# Patient Record
Sex: Female | Born: 1965 | Race: White | Hispanic: No | Marital: Married | Smoking: Never smoker
Health system: Southern US, Community
[De-identification: ages and names within clinical notes are randomized; demographics above are authoritative.]

## PROBLEM LIST (undated history)

## (undated) DIAGNOSIS — Z8489 Family history of other specified conditions: Secondary | ICD-10-CM

## (undated) DIAGNOSIS — F419 Anxiety disorder, unspecified: Secondary | ICD-10-CM

## (undated) DIAGNOSIS — K635 Polyp of colon: Secondary | ICD-10-CM

## (undated) DIAGNOSIS — I499 Cardiac arrhythmia, unspecified: Secondary | ICD-10-CM

## (undated) DIAGNOSIS — K219 Gastro-esophageal reflux disease without esophagitis: Secondary | ICD-10-CM

## (undated) DIAGNOSIS — L509 Urticaria, unspecified: Secondary | ICD-10-CM

## (undated) HISTORY — DX: Polyp of colon: K63.5

## (undated) HISTORY — PX: COLONOSCOPY WITH PROPOFOL: SHX5780

## (undated) HISTORY — DX: Urticaria, unspecified: L50.9

---

## 1990-11-28 HISTORY — PX: LAPAROSCOPY: SHX197

## 1998-10-26 ENCOUNTER — Other Ambulatory Visit: Admission: RE | Admit: 1998-10-26 | Discharge: 1998-10-26 | Payer: Self-pay | Admitting: Obstetrics & Gynecology

## 2000-02-08 ENCOUNTER — Other Ambulatory Visit: Admission: RE | Admit: 2000-02-08 | Discharge: 2000-02-08 | Payer: Self-pay | Admitting: Obstetrics & Gynecology

## 2001-05-08 ENCOUNTER — Other Ambulatory Visit: Admission: RE | Admit: 2001-05-08 | Discharge: 2001-05-08 | Payer: Self-pay | Admitting: Obstetrics & Gynecology

## 2002-07-23 ENCOUNTER — Other Ambulatory Visit: Admission: RE | Admit: 2002-07-23 | Discharge: 2002-07-23 | Payer: Self-pay | Admitting: Obstetrics & Gynecology

## 2003-10-07 ENCOUNTER — Other Ambulatory Visit: Admission: RE | Admit: 2003-10-07 | Discharge: 2003-10-07 | Payer: Self-pay | Admitting: Obstetrics & Gynecology

## 2005-02-02 ENCOUNTER — Other Ambulatory Visit: Admission: RE | Admit: 2005-02-02 | Discharge: 2005-02-02 | Payer: Self-pay | Admitting: Obstetrics & Gynecology

## 2006-10-30 ENCOUNTER — Encounter: Admission: RE | Admit: 2006-10-30 | Discharge: 2006-10-30 | Payer: Self-pay | Admitting: Obstetrics & Gynecology

## 2006-11-07 ENCOUNTER — Encounter: Admission: RE | Admit: 2006-11-07 | Discharge: 2006-11-07 | Payer: Self-pay | Admitting: Obstetrics & Gynecology

## 2007-11-01 ENCOUNTER — Encounter: Admission: RE | Admit: 2007-11-01 | Discharge: 2007-11-01 | Payer: Self-pay | Admitting: Obstetrics & Gynecology

## 2007-11-07 ENCOUNTER — Encounter: Admission: RE | Admit: 2007-11-07 | Discharge: 2007-11-07 | Payer: Self-pay | Admitting: Obstetrics & Gynecology

## 2008-11-05 ENCOUNTER — Encounter: Admission: RE | Admit: 2008-11-05 | Discharge: 2008-11-05 | Payer: Self-pay | Admitting: Obstetrics & Gynecology

## 2008-11-13 ENCOUNTER — Encounter: Admission: RE | Admit: 2008-11-13 | Discharge: 2008-11-13 | Payer: Self-pay | Admitting: Obstetrics & Gynecology

## 2009-04-24 ENCOUNTER — Emergency Department (HOSPITAL_BASED_OUTPATIENT_CLINIC_OR_DEPARTMENT_OTHER): Admission: EM | Admit: 2009-04-24 | Discharge: 2009-04-24 | Payer: Self-pay | Admitting: Emergency Medicine

## 2009-04-24 ENCOUNTER — Ambulatory Visit: Payer: Self-pay | Admitting: Diagnostic Radiology

## 2009-05-06 ENCOUNTER — Encounter: Admission: RE | Admit: 2009-05-06 | Discharge: 2009-05-06 | Payer: Self-pay | Admitting: Obstetrics & Gynecology

## 2009-11-10 ENCOUNTER — Encounter: Admission: RE | Admit: 2009-11-10 | Discharge: 2009-11-10 | Payer: Self-pay | Admitting: Obstetrics & Gynecology

## 2010-08-02 IMAGING — CR DG CHEST 2V
2 series · 2 of 2 positions shown · non-contrast
Comparison: None

CLINICAL DATA: Left-sided neck pain, numbness

CHEST - 2 VIEW

[w chest pa]
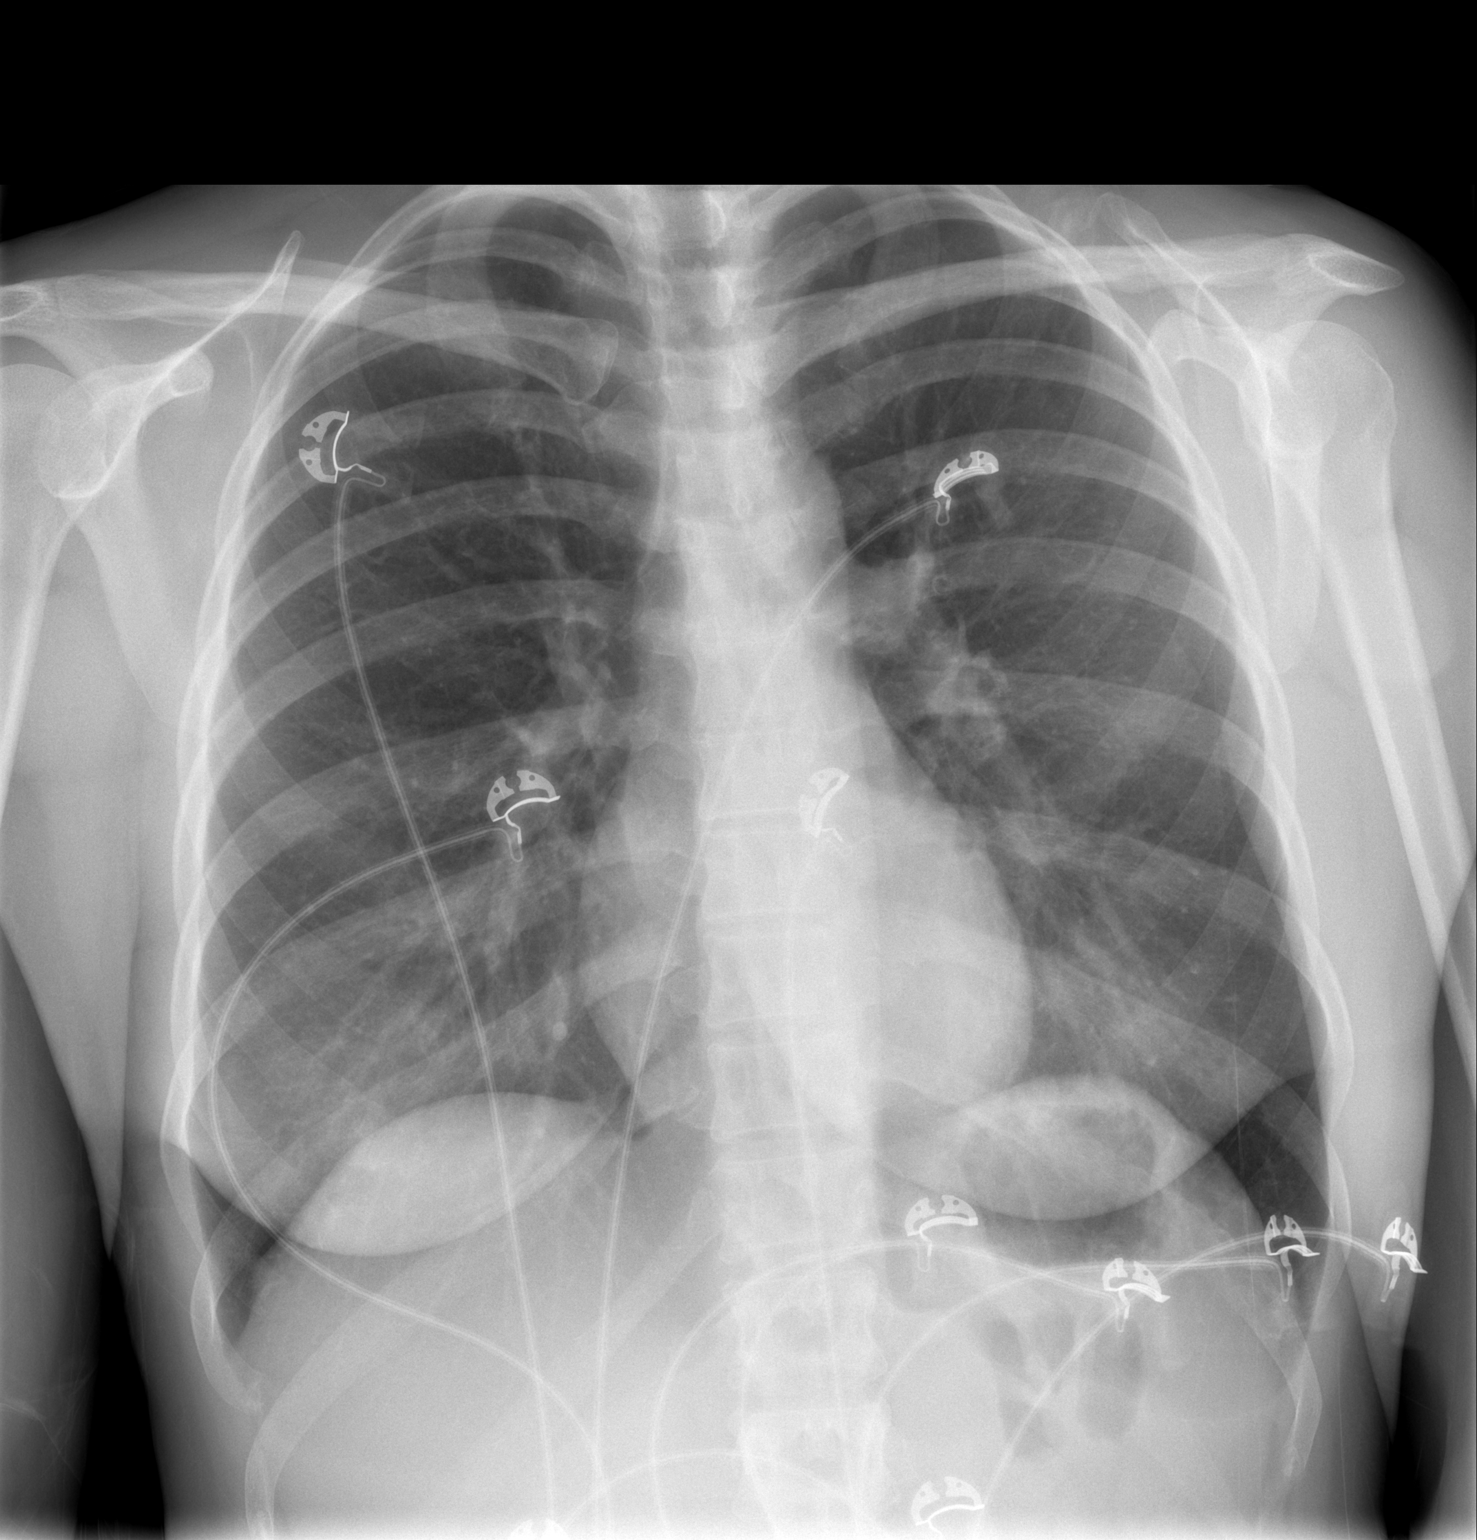

[w chest lat]
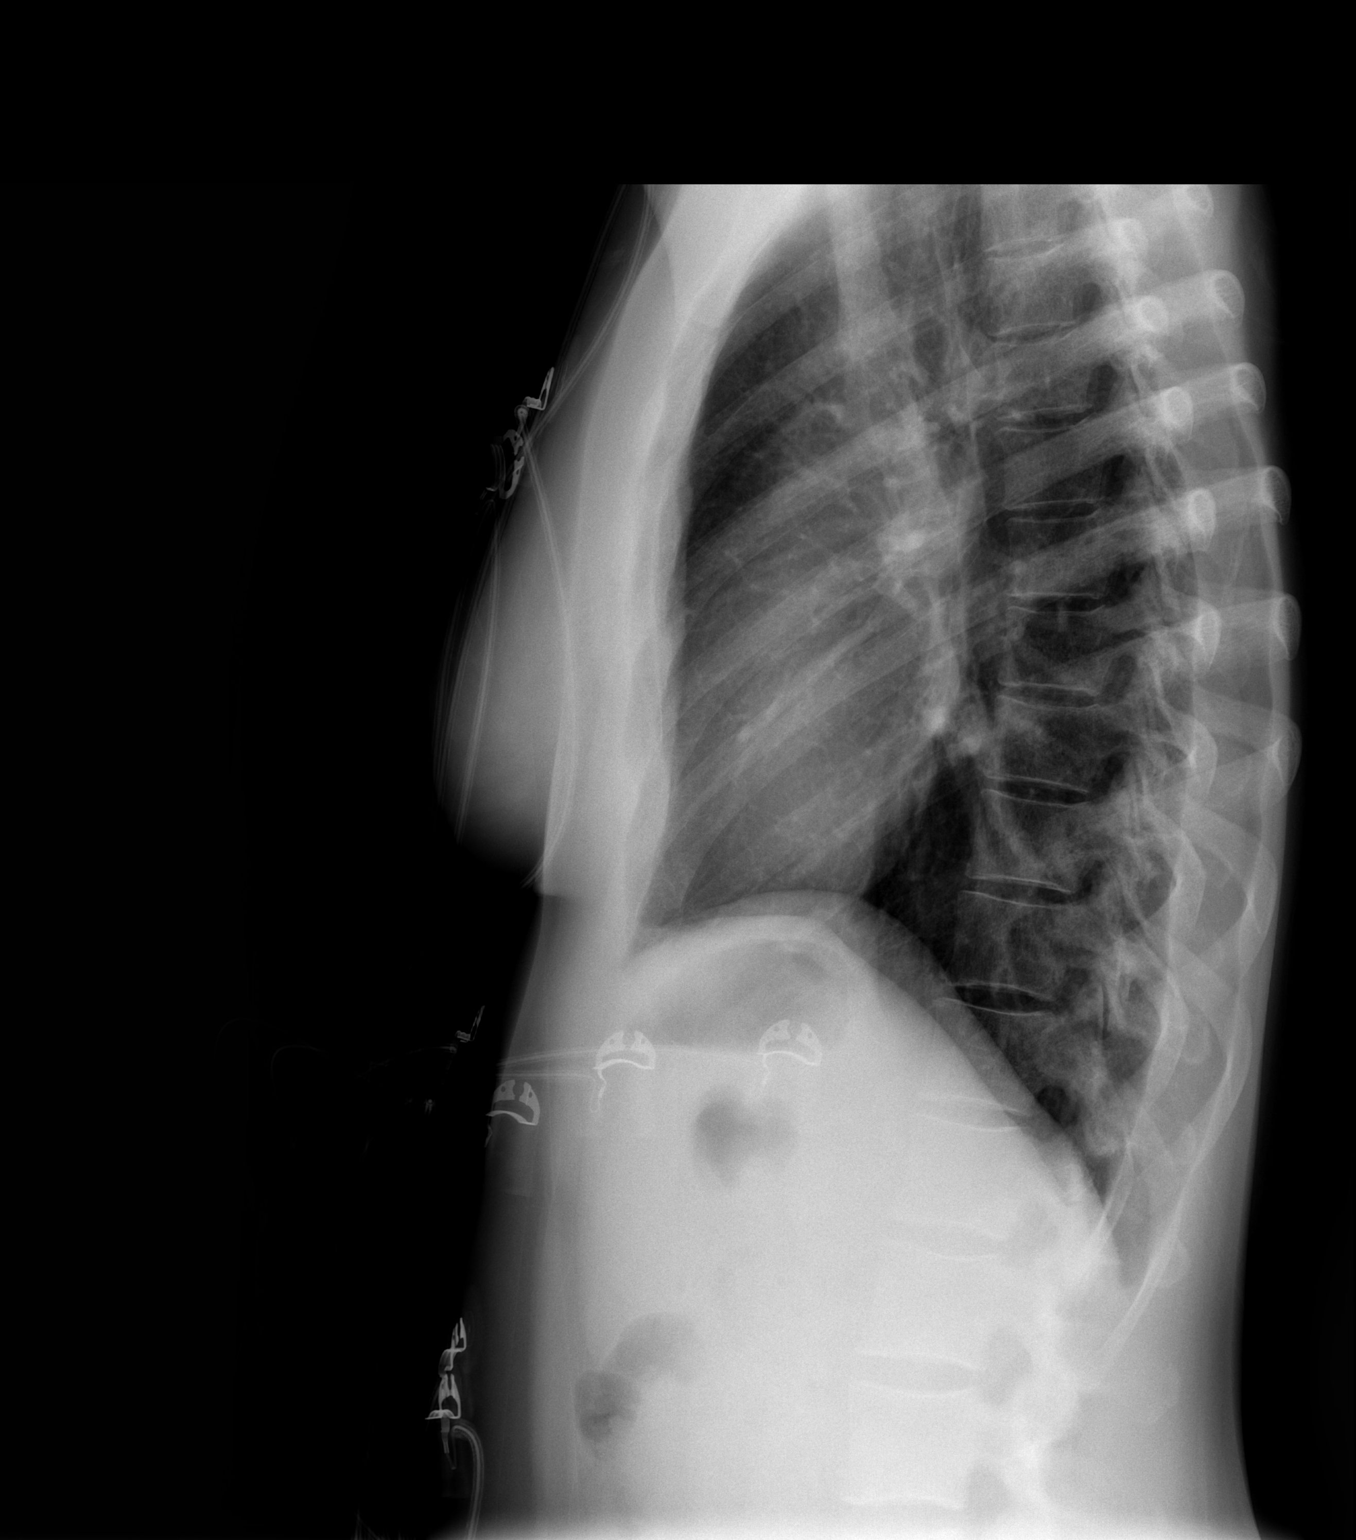

[2 of 2 positions shown; findings below may reference images not displayed]

FINDINGS: Cardiomediastinal silhouette is unremarkable.  No acute
infiltrate or pleural effusion.  No pulmonary edema.  Bony thorax
is unremarkable.  No pneumothorax.
IMPRESSION: No active cardiopulmonary disease.

## 2010-10-09 ENCOUNTER — Ambulatory Visit: Payer: Self-pay | Admitting: Family Medicine

## 2010-10-09 DIAGNOSIS — S61209A Unspecified open wound of unspecified finger without damage to nail, initial encounter: Secondary | ICD-10-CM | POA: Insufficient documentation

## 2010-10-10 ENCOUNTER — Encounter: Payer: Self-pay | Admitting: Family Medicine

## 2010-10-11 ENCOUNTER — Telehealth (INDEPENDENT_AMBULATORY_CARE_PROVIDER_SITE_OTHER): Payer: Self-pay | Admitting: *Deleted

## 2010-11-16 ENCOUNTER — Encounter
Admission: RE | Admit: 2010-11-16 | Discharge: 2010-11-16 | Payer: Self-pay | Source: Home / Self Care | Attending: Obstetrics & Gynecology | Admitting: Obstetrics & Gynecology

## 2010-12-19 ENCOUNTER — Encounter: Payer: Self-pay | Admitting: Obstetrics & Gynecology

## 2010-12-30 NOTE — Assessment & Plan Note (Signed)
Summary: Laceration - L index finger x today proc rm   Vital Signs:  Patient Profile:   45 Years Old Female CC:      Laceration - L index finger x today Height:     60.5 inches Weight:      121 pounds O2 Sat:      100 % O2 treatment:    Room Air Temp:     98.5 degrees F oral Pulse rate:   69 / minute Pulse rhythm:   regular Resp:     16 per minute BP sitting:   105 / 71  (left arm) Cuff size:   regular  Vitals Entered By: Areta Haber CMA (October 09, 2010 12:21 PM)                  Current Allergies (reviewed today): ! LEVAQUIN     History of Present Illness Chief Complaint: Laceration - L index finger x today History of Present Illness:  Subjective:  Patient cut her left 2nd finger today while cutting greens.  Does not remember last tetanus shot.  Current Problems: LACERATION OF FINGER (ICD-883.0) FAMILY HISTORY DEPRESSION (ICD-V17.0)   Current Meds DAILY MULTIPLE VITAMINS  TABS (MULTIPLE VITAMIN) 1 tab by mouth once daily  REVIEW OF SYSTEMS Constitutional Symptoms      Denies fever, chills, night sweats, weight loss, weight gain, and fatigue.  Eyes       Denies change in vision, eye pain, eye discharge, glasses, contact lenses, and eye surgery. Ear/Nose/Throat/Mouth       Denies hearing loss/aids, change in hearing, ear pain, ear discharge, dizziness, frequent runny nose, frequent nose bleeds, sinus problems, sore throat, hoarseness, and tooth pain or bleeding.  Respiratory       Denies dry cough, productive cough, wheezing, shortness of breath, asthma, bronchitis, and emphysema/COPD.  Cardiovascular       Denies murmurs, chest pain, and tires easily with exhertion.    Gastrointestinal       Denies stomach pain, nausea/vomiting, diarrhea, constipation, blood in bowel movements, and indigestion. Genitourniary       Denies painful urination, kidney stones, and loss of urinary control. Neurological       Denies paralysis, seizures, and  fainting/blackouts. Musculoskeletal       Denies muscle pain, joint pain, joint stiffness, decreased range of motion, redness, swelling, muscle weakness, and gout.  Skin       Denies bruising, unusual mles/lumps or sores, and hair/skin or nail changes.  Psych       Denies mood changes, temper/anger issues, anxiety/stress, speech problems, depression, and sleep problems. Other Comments: Laceration - L index finger x today. Pt states she was cutting up greens, cut her finger. Pt states she does not know when she had her last Tetanus.   Past History:  Past Medical History: Unremarkable  Past Surgical History: Laparoscopy  Family History: Family History Depression epilepsy  Social History: Married Never Smoked Alcohol use-yes - 1 drink a week Drug use-no Regular exercise-yes Smoking Status:  never Drug Use:  no Does Patient Exercise:  yes   Objective:  Appearance:  Patient appears healthy, stated age, and in no acute distress  Left 2nd finger:  simple 1.5cm L-shaped superficial flap laceration over middle phalanx.  Finger has full range of motion.  Distal neurovascular intact  Assessment New Problems: LACERATION OF FINGER (ICD-883.0) FAMILY HISTORY DEPRESSION (ICD-V17.0)   Plan New Orders: Tdap => 69yrs IM [90715] Admin 1st Vaccine [90471] Repair Superficial Wound(s) 2.5cm or <(  scalp,neck,axillae,ext gent,trunk,extre) [12001] New Patient Level III [16109] Planning Comments:   Tdap given Wound precautions discussed.  Change bandage daily.  Return for any signs of infection.  Return 10 days for suture removal  The patient and/or caregiver has been counseled thoroughly with regard to medications prescribed including dosage, schedule, interactions, rationale for use, and possible side effects and they verbalize understanding.  Diagnoses and expected course of recovery discussed and will return if not improved as expected or if the condition worsens. Patient and/or caregiver verbalized understanding.   PROCEDURE:  Suture Site: Left 2nd finger Size: 1.5 cm Number of Lacerations: 1 Anesthesia: Digital with 2% lidocaine without epinephrine Procedure: Procedure:  Laceration Repair Discussed benefits and risks of procedure and verbal consent obtained. Using sterile technique and  digital 2% lidocaine without epinephrine, cleansed wound with Betadine followed by copious lavage with normal saline.  Wound carefully inspected for debris and foreign bodies; none found.  Wound closed with #5 , 4-0 interrupted nylon sutures.  Bacitracin and non-stick sterile dressing applied.  Wound precautions explained to patient.    Disposition: Home  Orders Added: 1)  Tdap => 32yrs IM [90715] 2)  Admin 1st Vaccine [90471] 3)  Repair Superficial Wound(s) 2.5cm or <(scalp,neck,axillae,ext gent,trunk,extre) [12001] 4)  New Patient Level III [16109]   Immunizations Administered:  Tetanus Vaccine:    Vaccine Type: Tdap    Site: left deltoid    Mfr: GlaxoSmithKline    Dose: 0.5 ml    Route: IM    Given by: Areta Haber CMA    Exp. Date: 09/16/2012    Lot #: [id number]    VIS given: 10/15/08 version given October 09, 2010.   Immunizations Administered:  Tetanus  Vaccine:    Vaccine Type: Tdap    Site: left deltoid    Mfr: GlaxoSmithKline    Dose: 0.5 ml    Route: IM    Given by: Areta Haber CMA    Exp. Date: 09/16/2012    Lot #: [id number]    VIS given: 10/15/08 version given October 09, 2010.

## 2010-12-30 NOTE — Miscellaneous (Signed)
Summary: TDAP FORM  TDAP FORM   Imported By: Dannette Barbara 10/10/2010 17:08:31  _____________________________________________________________________  External Attachment:    Type:   Image     Comment:   External Document

## 2010-12-30 NOTE — Progress Notes (Signed)
  Phone Note Outgoing Call Call back at [facility] Phone [phone]   Call placed by: Lajean Saver RN,  October 11, 2010 9:53 AM Call placed to: Patient Summary of Call: Callback: No answer. Message left with reason for call and call back with any questions. Reminded her to come back on 10/19/10 for suture removal

## 2011-03-08 LAB — POCT CARDIAC MARKERS
CKMB, poc: <1 ng/mL — ABNORMAL LOW (ref 1.0–8.0)
Myoglobin, poc: 39.8 ng/mL (ref 12–200)
Troponin i, poc: <0.05 ng/mL (ref 0.00–0.09)

## 2011-03-08 LAB — BASIC METABOLIC PANEL WITH GFR
BUN: 15 mg/dL (ref 6–23)
CO2: 28 meq/L (ref 19–32)
Calcium: 9 mg/dL (ref 8.4–10.5)
GFR calc non Af Amer: >60 mL/min (ref >60)
Glucose, Bld: 77 mg/dL (ref 70–99)
Potassium: 5 meq/L (ref 3.5–5.1)

## 2011-03-08 LAB — CBC
HCT: 41.4 % (ref 36.0–46.0)
Hemoglobin: 13.9 g/dL (ref 12.0–15.0)
MCHC: 33.4 g/dL (ref 30.0–36.0)
MCV: 88.1 fL (ref 78.0–100.0)
Platelets: 170 K/uL (ref 150–400)
RBC: 4.7 MIL/uL (ref 3.87–5.11)
RDW: 12.7 % (ref 11.5–15.5)
WBC: 8.4 10*3/uL (ref 4.0–10.5)

## 2011-03-08 LAB — DIFFERENTIAL
Basophils Absolute: 0.1 K/uL (ref 0.0–0.1)
Basophils Relative: 1 % (ref 0–1)
Eosinophils Absolute: 0.1 10*3/uL (ref 0.0–0.7)
Eosinophils Relative: 1 % (ref 0–5)
Lymphocytes Relative: 26 % (ref 12–46)
Lymphs Abs: 2.2 10*3/uL (ref 0.7–4.0)
Monocytes Absolute: 0.4 10*3/uL (ref 0.1–1.0)
Monocytes Relative: 5 % (ref 3–12)
Neutro Abs: 5.6 10*3/uL (ref 1.7–7.7)
Neutrophils Relative %: 66 % (ref 43–77)

## 2011-03-08 LAB — BASIC METABOLIC PANEL
Chloride: 106 mEq/L (ref 96–112)
Creatinine, Ser: 0.8 mg/dL (ref 0.4–1.2)
GFR calc Af Amer: >60 mL/min (ref >60)
Sodium: 140 mEq/L (ref 135–145)

## 2011-11-01 ENCOUNTER — Other Ambulatory Visit: Payer: Self-pay | Admitting: Obstetrics and Gynecology

## 2011-11-01 DIAGNOSIS — N644 Mastodynia: Secondary | ICD-10-CM

## 2011-11-02 ENCOUNTER — Ambulatory Visit
Admission: RE | Admit: 2011-11-02 | Discharge: 2011-11-02 | Disposition: A | Discharge status: Home / Self Care | Payer: BC Managed Care – PPO | Source: Ambulatory Visit | Attending: Obstetrics and Gynecology | Admitting: Obstetrics and Gynecology

## 2011-11-02 DIAGNOSIS — N644 Mastodynia: Secondary | ICD-10-CM

## 2011-11-29 DIAGNOSIS — L509 Urticaria, unspecified: Secondary | ICD-10-CM

## 2011-11-29 HISTORY — DX: Urticaria, unspecified: L50.9

## 2012-02-13 ENCOUNTER — Emergency Department (HOSPITAL_BASED_OUTPATIENT_CLINIC_OR_DEPARTMENT_OTHER)
Admission: EM | Admit: 2012-02-13 | Discharge: 2012-02-14 | Disposition: A | Discharge status: Home / Self Care | Payer: BC Managed Care – PPO | Attending: Emergency Medicine | Admitting: Emergency Medicine

## 2012-02-13 ENCOUNTER — Encounter (HOSPITAL_BASED_OUTPATIENT_CLINIC_OR_DEPARTMENT_OTHER): Payer: Self-pay | Admitting: *Deleted

## 2012-02-13 DIAGNOSIS — L509 Urticaria, unspecified: Secondary | ICD-10-CM

## 2012-02-13 DIAGNOSIS — K219 Gastro-esophageal reflux disease without esophagitis: Secondary | ICD-10-CM | POA: Insufficient documentation

## 2012-02-13 LAB — BASIC METABOLIC PANEL
GFR calc Af Amer: 88 mL/min — ABNORMAL LOW (ref >90)
GFR calc non Af Amer: 76 mL/min — ABNORMAL LOW (ref >90)
Potassium: 4 mEq/L (ref 3.5–5.1)
Sodium: 139 mEq/L (ref 135–145)

## 2012-02-13 LAB — URINALYSIS, ROUTINE W REFLEX MICROSCOPIC
Leukocytes, UA: NEGATIVE
Nitrite: NEGATIVE
Specific Gravity, Urine: 1.005 (ref 1.005–1.030)
Urobilinogen, UA: 0.2 mg/dL (ref 0.0–1.0)

## 2012-02-13 MED ORDER — DIPHENHYDRAMINE HCL 25 MG PO CAPS
25.0000 mg | ORAL_CAPSULE | Freq: Once | ORAL | Status: AC
Start: 2012-02-13 — End: 2012-02-13
  Administered 2012-02-13: 25 mg via ORAL
  Filled 2012-02-13 (×2): qty 1

## 2012-02-13 MED ORDER — PREDNISONE 50 MG PO TABS: 50.0000 mg | ORAL_TABLET | Freq: Every day | ORAL | Status: DC

## 2012-02-13 NOTE — ED Notes (Signed)
Has been treated for hives for about a week. Was initially given a steroid injection last week. She has been taking Zyrtec and started on Pepcid today. Has been shaky since starting the Pepcid.

## 2012-02-13 NOTE — Discharge Instructions (Signed)
Return to the ED with any concerns including difficulty breathing or swallowing, decreased level of alertness/lethargy, or any other alarming symptoms °

## 2012-02-13 NOTE — ED Notes (Signed)
Secondary Assessment-  Pt reports she developed feeling like she was going to pass out, tremors and chills. Symptoms were described liked "my blood sugar was dropping".  States she ate a "nut bar" but continued to have symptoms.  Currently being treated for hives and taking Zyrtec.  She reports back pain and nausea x several days but states it's related to allergic reaction.

## 2012-02-13 NOTE — ED Provider Notes (Signed)
History    This chart was scribed for Laurie Chick, MD, MD by Smitty Pluck. The patient was seen in room MH02 and the patient's care was started at 8:01PM.   CSN: [id number]  Arrival date & time 02/13/12  1717   First MD Initiated Contact with Patient 02/13/12 1858      Chief Complaint  Patient presents with  . Anxiety    (Consider location/radiation/quality/duration/timing/severity/associated sxs/prior treatment) The history is provided by the patient.   Laurie Parsons is a 46 y.o. female who presents to the Emergency Department complaining of moderate anxiety onset today. Pt reports she felt cold and felt that her blood glucose was low so she ate some peanuts. Shortly afterwards she began shaking 3 hours ago. She has decreased food intake this week. Pt is currently taking Zyrtec (10 mg) for hives due to allergic reaction onset 1 week ago. Pt reports that she has acid reflux. She is taking Pepcid. She went to [facility] for hives because they have gotten worse over the weekend, which was when pepcid was added.  She has felt improved today after pepcid until the episode of feeling shaky today at 5pm- this resolved with eating peanuts.. She had steroid shot 4 days ago.   History reviewed. No pertinent past medical history.  History reviewed. No pertinent past surgical history.  No family history on file.  History  Substance Use Topics  . Smoking status: Never Smoker   . Smokeless tobacco: Not on file  . Alcohol Use: Yes    OB History    Grav Para Term Preterm Abortions TAB SAB Ect Mult Living                  Review of Systems  All other systems reviewed and are negative.   10 Systems reviewed and are negative for acute change except as noted in the HPI.  Allergies  Levofloxacin and Macrobid  Home Medications   Current Outpatient Rx  Name Route Sig Dispense Refill  . CETIRIZINE HCL 10 MG PO TABS Oral Take 10 mg by mouth daily.      Marland Kitchen DIPHENHYDRAMINE HCL 25 MG PO TABS Oral Take 25 mg by mouth every 6 (six) hours as needed. Patient used this medication for allergies.    Marland Kitchen FAMOTIDINE 40 MG PO TABS Oral Take 40 mg by mouth daily.    Marland Kitchen PREDNISONE 50 MG PO TABS Oral Take 1 tablet (50 mg total) by mouth daily. 5 tablet 0    BP 102/60  Pulse 72  Temp(Src) 98.5 F (36.9 C) (Oral)  Resp 16  SpO2 100% Vitals reviewed Physical Exam  Nursing note and vitals reviewed. Constitutional: She is oriented to person, place, and time. She appears well-developed and well-nourished. No distress.  HENT:  Head: Normocephalic and atraumatic.  Mouth/Throat: Oropharynx is clear and moist.  Eyes: Conjunctivae are normal. Pupils are equal, round, and reactive to light.  Neck: Normal range of motion. Neck supple.  Cardiovascular: Normal rate, regular rhythm and normal heart sounds.   Pulmonary/Chest: Effort normal and breath sounds normal. No respiratory distress.  Abdominal: Soft. She exhibits no distension. There is no tenderness.  Musculoskeletal: She exhibits no tenderness.  Neurological: She is alert and oriented to person, place, and time.  Psychiatric: She has a normal mood and affect. Her behavior is normal.    ED Course  Procedures (including critical care time) DIAGNOSTIC STUDIES: Oxygen Saturation is 100% on room air, normal by my  interpretation.    COORDINATION OF CARE: 8:20PM EDP discusses pt ED treatment course with pt 8:45PM EDP ordered medication: benadryl 25 mg  Labs Reviewed  BASIC METABOLIC PANEL - Abnormal; Notable for the following:    GFR calc non Af Amer 76 (*)    GFR calc Af Amer 88 (*)    All other components within normal limits  URINALYSIS, ROUTINE W REFLEX MICROSCOPIC   No results found.   1. Hives       MDM  Pt presenting with continued hives after 4 days- she has been treated with zyrtec and pepcid, as well as solumedrol injection 4 days ago.  The hives have worsened and today she saw her  PMD who added pepcid to help with hives as well as her report of reflux symtpoms.  She has had no difficulty breathing, lip or tongue swelling.  I have discussed adding prednisone to her regimen, but she is concerned that this will make her reflux symptoms worse.  I have given her a prescription for steroids which she will decide whether to take or not.  Pt to continue zyrtec and pepcid.  Her PMD is arranging an allergy referral. Given strict return precuations.  Pt agreeable with this plan.   I personally performed the services described in this documentation, which was scribed in my presence. The recorded information has been reviewed and considered.        Laurie Chick, MD 02/14/12 [phone]

## 2012-02-22 DIAGNOSIS — L508 Other urticaria: Secondary | ICD-10-CM | POA: Insufficient documentation

## 2012-10-17 ENCOUNTER — Other Ambulatory Visit: Payer: Self-pay | Admitting: Obstetrics and Gynecology

## 2012-10-17 ENCOUNTER — Other Ambulatory Visit: Payer: Self-pay | Admitting: Obstetrics & Gynecology

## 2012-10-17 DIAGNOSIS — Z1231 Encounter for screening mammogram for malignant neoplasm of breast: Secondary | ICD-10-CM

## 2012-11-26 ENCOUNTER — Ambulatory Visit
Admission: RE | Admit: 2012-11-26 | Discharge: 2012-11-26 | Disposition: A | Discharge status: Home / Self Care | Payer: BC Managed Care – PPO | Source: Ambulatory Visit | Attending: Obstetrics & Gynecology | Admitting: Obstetrics & Gynecology

## 2012-11-26 DIAGNOSIS — Z1231 Encounter for screening mammogram for malignant neoplasm of breast: Secondary | ICD-10-CM

## 2013-04-29 ENCOUNTER — Telehealth: Payer: Self-pay | Admitting: Gynecology

## 2013-04-29 NOTE — Telephone Encounter (Signed)
FYI only for Dr. Farrel Gobble: Patient cancel her appointment for 05/07/13 for office visit. Patient reports she did not start the birth control Dr. Farrel Gobble prescribed so doesn't need appointment.

## 2013-05-07 ENCOUNTER — Ambulatory Visit: Payer: Self-pay | Admitting: Gynecology

## 2013-05-08 DIAGNOSIS — N390 Urinary tract infection, site not specified: Secondary | ICD-10-CM | POA: Insufficient documentation

## 2013-05-08 DIAGNOSIS — R319 Hematuria, unspecified: Secondary | ICD-10-CM | POA: Insufficient documentation

## 2013-05-08 DIAGNOSIS — R350 Frequency of micturition: Secondary | ICD-10-CM | POA: Insufficient documentation

## 2014-01-10 ENCOUNTER — Other Ambulatory Visit: Payer: Self-pay

## 2014-01-10 DIAGNOSIS — Z1231 Encounter for screening mammogram for malignant neoplasm of breast: Secondary | ICD-10-CM

## 2014-01-20 ENCOUNTER — Encounter: Payer: Self-pay | Admitting: Gynecology

## 2014-01-21 ENCOUNTER — Telehealth: Payer: Self-pay | Admitting: Gynecology

## 2014-01-21 ENCOUNTER — Ambulatory Visit: Payer: Self-pay | Admitting: Gynecology

## 2014-01-21 NOTE — Telephone Encounter (Signed)
Patient [facility] her aex appointment today with Dr. Charlies Constable, this may have been due to inclement weather. I left a message for the patient to call and reschedule. (pt. In recall).

## 2014-01-28 ENCOUNTER — Ambulatory Visit
Admission: RE | Admit: 2014-01-28 | Discharge: 2014-01-28 | Disposition: A | Discharge status: Home / Self Care | Payer: BC Managed Care – PPO | Source: Ambulatory Visit

## 2014-01-28 DIAGNOSIS — Z1231 Encounter for screening mammogram for malignant neoplasm of breast: Secondary | ICD-10-CM

## 2014-02-04 ENCOUNTER — Encounter: Payer: Self-pay | Admitting: Gynecology

## 2014-02-04 ENCOUNTER — Ambulatory Visit (INDEPENDENT_AMBULATORY_CARE_PROVIDER_SITE_OTHER): Payer: BC Managed Care – PPO | Admitting: Gynecology

## 2014-02-04 VITALS — BP 104/70 | HR 65 | Resp 14 | Ht 62.0 in | Wt 129.0 lb

## 2014-02-04 DIAGNOSIS — Z0000 Encounter for general adult medical examination without abnormal findings: Secondary | ICD-10-CM

## 2014-02-04 DIAGNOSIS — N809 Endometriosis, unspecified: Secondary | ICD-10-CM | POA: Insufficient documentation

## 2014-02-04 DIAGNOSIS — Z01419 Encounter for gynecological examination (general) (routine) without abnormal findings: Secondary | ICD-10-CM

## 2014-02-04 LAB — LIPID PANEL
Cholesterol: 222 mg/dL — ABNORMAL HIGH (ref 0–200)
HDL: 83 mg/dL (ref >39)
LDL CALC: 130 mg/dL — AB (ref 0–99)
Total CHOL/HDL Ratio: 2.7 Ratio
Triglycerides: 44 mg/dL (ref <150)
VLDL: 9 mg/dL (ref 0–40)

## 2014-02-04 LAB — GLUCOSE, RANDOM: GLUCOSE: 78 mg/dL (ref 70–99)

## 2014-02-04 NOTE — Patient Instructions (Signed)

## 2014-02-04 NOTE — Progress Notes (Addendum)
48 y.o. Married Caucasian female   [id number] here for annual exam. Pt is currently sexually active.  Pt is using vitex extract for hot flashes.  menses are every 75m, first night using tampon and pad and filling and will sometimes need to change.  Flow for 4-5d,  Rest is  Normal flow. Pt has some dryness, using astroglide. Pt with histroy of endometriosis.   No LMP recorded.          Sexually active: yes  The current method of family planning is none.    Exercising: yes  tennis, walking, yoga weekly Last pap:  01/02/12 NEG HR HPV Alcohol: 2 drinks/wk Tobacco: no BSE: no  Mammogram: 01/30/14 Bi-Rads 1 (In Epic) PCP: Anastasia Pall, MD   Hgb: 14.1  Urine: Negative    Health Maintenance  Topic Date Due  . Pap Smear  03/22/1984  . Influenza Vaccine  06/28/2013  . Tetanus/tdap  10/11/2020    Family History  Problem Relation Age of Onset  . Hypertension Paternal Grandfather   . Heart attack Paternal Grandfather   . Depression Father   . Anxiety disorder Sister     Patient Active Problem List   Diagnosis Date Noted  . LACERATION OF FINGER 10/09/2010    Past Medical History  Diagnosis Date  . Hives 2013    Chronic     Past Surgical History  Procedure Laterality Date  . Laparoscopy  1992    due to endometriosis - Lupron x 6 months    Allergies: Levofloxacin and Nitrofurantoin monohyd macro  Current Outpatient Prescriptions  Medication Sig Dispense Refill  . cetirizine (ZYRTEC ALLERGY) 10 MG tablet Take 10 mg by mouth daily.      . Coenzyme Q10 (COQ10 PO) Take by mouth.      . diphenhydrAMINE (BENADRYL) 25 MG tablet Take 25 mg by mouth every 6 (six) hours as needed. Patient used this medication for allergies.      Marland Kitchen doxepin (SINEQUAN) 10 MG capsule Take 10 mg by mouth. Takes 2 1/2 to 3 1/2 mg      . famotidine (PEPCID) 40 MG tablet Take 40 mg by mouth daily.      . Multiple Vitamins-Minerals (MULTIVITAMIN PO) Take by mouth.      . Omega-3 Fatty Acids (FISH OIL PO) Take by  mouth.      . predniSONE (DELTASONE) 50 MG tablet Take 1 tablet (50 mg total) by mouth daily.  5 tablet  0   No current facility-administered medications for this visit.    ROS: Pertinent items are noted in HPI.  Exam:    BP 104/70  Pulse 65  Resp 14  Ht 5\' 2"  (1.575 m)  Wt 129 lb (58.514 kg)  BMI 23.59 kg/m2 Weight change: @WEIGHTCHANGE @ Last 3 height recordings:  Ht Readings from Last 3 Encounters:  02/04/14 5\' 2"  (1.575 m)  10/09/10 5' 0.5" (1.537 m)   General appearance: alert, cooperative and appears stated age Head: Normocephalic, without obvious abnormality, atraumatic Neck: no adenopathy, no carotid bruit, no JVD, supple, symmetrical, trachea midline and thyroid not enlarged, symmetric, no tenderness/mass/nodules Lungs: clear to auscultation bilaterally Breasts: normal appearance, no masses or tenderness Heart: regular rate and rhythm, S1, S2 normal, no murmur, click, rub or gallop Abdomen: soft, non-tender; bowel sounds normal; no masses,  no organomegaly Extremities: extremities normal, atraumatic, no cyanosis or edema Skin: Skin color, texture, turgor normal. No rashes or lesions Lymph nodes: Cervical, supraclavicular, and axillary nodes normal. no inguinal nodes palpated  Neurologic: Grossly normal   Pelvic: External genitalia:  no lesions              Urethra: normal appearing urethra with no masses, tenderness or lesions              Bartholins and Skenes: normal                 Vagina: normal appearing vagina with normal color and discharge, no lesions              Cervix: normal appearance              Pap taken: no        Bimanual Exam:  Uterus:  uterus is normal size, shape, consistency and nontender                                      Adnexa:    no masses                                      Rectovaginal: Confirms                                      Anus:  normal sphincter tone, no lesions  A: well woman perimenopause     P: mammogram counseled on  breast self exam, mammography screening, menopause, adequate intake of calcium and vitamin D, diet and exercise return annually or prn   An After Visit Summary was printed and given to the patient.

## 2014-02-05 LAB — HEMOGLOBIN, FINGERSTICK: Hemoglobin, fingerstick: 14.1 g/dL (ref 12.0–16.0)

## 2014-10-27 ENCOUNTER — Telehealth: Payer: Self-pay | Admitting: Gynecology

## 2014-10-27 NOTE — Telephone Encounter (Signed)
Left a message regarding upcoming appointment needs to be rescheduled.

## 2015-01-02 ENCOUNTER — Telehealth: Payer: Self-pay | Admitting: Certified Nurse Midwife

## 2015-01-02 ENCOUNTER — Other Ambulatory Visit: Payer: Self-pay

## 2015-01-02 NOTE — Telephone Encounter (Signed)
Pt says both breasts are tender and swollen. Tried to get an appointment with the [facility] but she says she need a referral.

## 2015-01-02 NOTE — Telephone Encounter (Signed)
Spoke with patient. Complains of bilateral breast tenderness x 3 weeks.  No recent Med changes, not on HRT or oral contraception.   No redness or streaking of skin on breasts. Called to schedule Mammogram at [facility]  and was advised would need referral.   Patient scheduled for Breast Check with Regina Eck CNM for Monday 01/05/15. Patient agreeable. Routing to provider for final review. Patient agreeable to disposition. Will close encounter

## 2015-01-05 ENCOUNTER — Ambulatory Visit (INDEPENDENT_AMBULATORY_CARE_PROVIDER_SITE_OTHER): Payer: Managed Care, Other (non HMO) | Admitting: Certified Nurse Midwife

## 2015-01-05 ENCOUNTER — Other Ambulatory Visit: Payer: Self-pay | Admitting: Certified Nurse Midwife

## 2015-01-05 ENCOUNTER — Encounter: Payer: Self-pay | Admitting: Certified Nurse Midwife

## 2015-01-05 VITALS — BP 100/64 | HR 64 | Resp 16 | Ht 62.0 in | Wt 143.0 lb

## 2015-01-05 DIAGNOSIS — N644 Mastodynia: Secondary | ICD-10-CM

## 2015-01-05 DIAGNOSIS — Z1231 Encounter for screening mammogram for malignant neoplasm of breast: Secondary | ICD-10-CM

## 2015-01-05 DIAGNOSIS — N951 Menopausal and female climacteric states: Secondary | ICD-10-CM

## 2015-01-05 NOTE — Progress Notes (Signed)
[facility]oke with [facility] to get patient scheduled for bilateral dx imaging of breast due to bilateral tenderness. Per the [facility] with tenderness being generalized patient will only need a screening mammogram. Patient is not due for screening until March 3rd. Appointment scheduled for March 4th at 8:30am. Patient is agreeable to date and time. Advised if tenderness returns or there are any changes with breast to give our office a call. Patient is agreeable.

## 2015-01-05 NOTE — Patient Instructions (Signed)

## 2015-01-05 NOTE — Progress Notes (Signed)
   Subjective:   49 y.o. MarriedCaucasian female presents for evaluation of breast tenderness for about the past month .Marland Kitchen Patient sought evaluation because of generalized breast tenderness.  Contributing factors include none. Denies none. Patient denies history of trauma, bites, or injuries. Last mammogram was 01/30/14. Patient had called to schedule regular mammogram and when answering their questions she said yes to tenderness and was told to come in for breast exam prior to scheduling. Patient had amenorrhea since 08/08/14, usual pattern of menses every 3 months. She started menses 01/03/15 and is continuing with period today. Breast tenderness is resolving. Denies any lumps, nipple discharge or change in skin.   Review of Systems Pertinent items are noted in HPI.  Objective:   General appearance: alert, cooperative and no distress Breasts: normal appearance, no masses or tenderness, No nipple retraction or dimpling, No nipple discharge or bleeding, No axillary or supraclavicular adenopathy  Assessment:   ASSESSMENT:Patient is diagnosed with normal breast exam with premenstrual breast tenderness  Perimenopausal with episodes of amenorrhea   Plan:   PLAN: Discussed with patient findings of normal breast exam and feel breast tenderness related to menses onset. Patient agrees as the tenderness is resolving as period progresses but is still concerned. Patient will be scheduled for diagnostic mammogram due to breast tenderness, due to her concern.  Continue regular SBE. Discussed etiology of perimenopausal and importance of keeping menses calendar and advise if no menses in 3 months. She may need lab work at that time.Given menopausal handout. Patient will advise at aex examined scheduled in 4/16 with me.

## 2015-01-07 NOTE — Progress Notes (Signed)
Reviewed personally.  M. Suzanne Alayiah Fontes, MD.  

## 2015-01-30 ENCOUNTER — Ambulatory Visit
Admission: RE | Admit: 2015-01-30 | Discharge: 2015-01-30 | Disposition: A | Discharge status: Home / Self Care | Payer: Managed Care, Other (non HMO) | Source: Ambulatory Visit | Attending: Certified Nurse Midwife | Admitting: Certified Nurse Midwife

## 2015-01-30 ENCOUNTER — Ambulatory Visit: Payer: Self-pay

## 2015-01-30 DIAGNOSIS — N644 Mastodynia: Secondary | ICD-10-CM

## 2015-01-30 DIAGNOSIS — Z1231 Encounter for screening mammogram for malignant neoplasm of breast: Secondary | ICD-10-CM

## 2015-02-06 ENCOUNTER — Ambulatory Visit: Payer: BC Managed Care – PPO | Admitting: Gynecology

## 2015-03-05 ENCOUNTER — Ambulatory Visit (INDEPENDENT_AMBULATORY_CARE_PROVIDER_SITE_OTHER): Payer: Managed Care, Other (non HMO) | Admitting: Certified Nurse Midwife

## 2015-03-05 ENCOUNTER — Encounter: Payer: Self-pay | Admitting: Certified Nurse Midwife

## 2015-03-05 VITALS — BP 102/68 | HR 72 | Resp 16 | Ht 61.25 in | Wt 137.0 lb

## 2015-03-05 DIAGNOSIS — N912 Amenorrhea, unspecified: Secondary | ICD-10-CM

## 2015-03-05 DIAGNOSIS — Z0000 Encounter for general adult medical examination without abnormal findings: Secondary | ICD-10-CM | POA: Diagnosis not present

## 2015-03-05 DIAGNOSIS — Z01419 Encounter for gynecological examination (general) (routine) without abnormal findings: Secondary | ICD-10-CM | POA: Diagnosis not present

## 2015-03-05 DIAGNOSIS — Z124 Encounter for screening for malignant neoplasm of cervix: Secondary | ICD-10-CM | POA: Diagnosis not present

## 2015-03-05 LAB — POCT URINALYSIS DIPSTICK
BILIRUBIN UA: NEGATIVE
GLUCOSE UA: NEGATIVE
Ketones, UA: NEGATIVE
LEUKOCYTES UA: NEGATIVE
Nitrite, UA: NEGATIVE
PH UA: 5
Protein, UA: NEGATIVE
RBC UA: NEGATIVE
Urobilinogen, UA: NEGATIVE

## 2015-03-05 LAB — POCT URINE PREGNANCY: Preg Test, Ur: NEGATIVE

## 2015-03-05 NOTE — Patient Instructions (Signed)

## 2015-03-05 NOTE — Progress Notes (Signed)
49 y.o. G0P0000 Married  Caucasian Fe here for annual exam. Perimenopausal with cycle changes. Patient keeping menses calendar and will advise if no period by May 2016. Occasional hot flashes and night sweats. Some episodic nausea, but as cut all sugar from diet. Denies any fainting or dizziness. Sees PCP prn. Would like to schedule fasting labs. No other health issues today.  Patient's last menstrual period was 01/04/2015.          Sexually active: Yes.    The current method of family planning is post menopausal status.    Exercising: Yes.    walking & tennis Smoker:  no  Health Maintenance: Pap: 01-02-12 neg HPV HR neg MMG:  01-30-15 category c density,birads 1:neg Colonoscopy:  2012 BMD:   none TDaP:  2011  Labs: Poct urine-neg, UPT-neg Self breast exam: rarely   reports that she has never smoked. She does not have any smokeless tobacco history on file. She reports that she drinks about 0.6 - 1.2 oz of alcohol per week. She reports that she does not use illicit drugs.  Past Medical History  Diagnosis Date  . Hives 2013    Chronic     Past Surgical History  Procedure Laterality Date  . Laparoscopy  1992    due to endometriosis - Lupron x 6 months    Current Outpatient Prescriptions  Medication Sig Dispense Refill  . Chaste Tree (VITEX EXTRACT PO) Take by mouth daily.    . Cholecalciferol (VITAMIN D-3) 1000 UNITS CAPS Take 1 capsule by mouth daily.    . Coenzyme Q10 (COQ10 PO) Take by mouth.    . Multiple Vitamins-Minerals (MULTIVITAMIN PO) Take by mouth.    . Omega-3 Fatty Acids (FISH OIL PO) Take by mouth.     No current facility-administered medications for this visit.    Family History  Problem Relation Age of Onset  . Hypertension Paternal Grandfather   . Heart attack Paternal Grandfather   . Depression Father   . Anxiety disorder Sister     ROS:  Pertinent items are noted in HPI.  Otherwise, a comprehensive ROS was negative.  Exam:   BP 102/68 mmHg  Pulse 72   Resp 16  Ht 5' 1.25" (1.556 m)  Wt 137 lb (62.143 kg)  BMI 25.67 kg/m2  LMP 01/04/2015 Height: 5' 1.25" (155.6 cm) Ht Readings from Last 3 Encounters:  03/05/15 5' 1.25" (1.556 m)  01/05/15 5\' 2"  (1.575 m)  02/04/14 5\' 2"  (1.575 m)    General appearance: alert, cooperative and appears stated age Head: Normocephalic, without obvious abnormality, atraumatic Neck: no adenopathy, supple, symmetrical, trachea midline and thyroid normal to inspection and palpation Lungs: clear to auscultation bilaterally Breasts: normal appearance, no masses or tenderness, No nipple retraction or dimpling, No nipple discharge or bleeding, No axillary or supraclavicular adenopathy Heart: regular rate and rhythm Abdomen: soft, non-tender; no masses,  no organomegaly Extremities: extremities normal, atraumatic, no cyanosis or edema Skin: Skin color, texture, turgor normal. No rashes or lesions Lymph nodes: Cervical, supraclavicular, and axillary nodes normal. No abnormal inguinal nodes palpated Neurologic: Grossly normal   Pelvic: External genitalia:  no lesions              Urethra:  normal appearing urethra with no masses, tenderness or lesions              Bartholin's and Skene's: normal                 Vagina: normal appearing vagina  with normal color and discharge, no lesions              Cervix: normal, appearance, non tender              Pap taken: Yes.   Bimanual Exam:  Uterus:  normal size, contour, position, consistency, mobility, non-tender              Adnexa: normal adnexa and no mass, fullness, tenderness               Rectovaginal: Confirms               Anus:  normal sphincter tone, no lesions  Chaperone present: Yes  A:  Well Woman with normal exam  Perimenopausal with cycle changes, keeping menstrual calendar  Schedule fasting labs  ? Blood glucose fluctations    P:   Reviewed health and wellness pertinent to exam  Discussed importance of notifying if no period by May.  Labs:  CMP,TSH,Lipid panel, Vit. D, TSH,Hgb.A1-C,CBC  Discussed eating protein when she is having this occurrence and stay well hydrated to see if this resolves. If continues may need to see GI.  Pap smear taken today with HPVHR   counseled on breast self exam, mammography screening, menopause, adequate intake of calcium and vitamin D, diet and exercise  return annually or prn  An After Visit Summary was printed and given to the patient.

## 2015-03-10 ENCOUNTER — Other Ambulatory Visit (INDEPENDENT_AMBULATORY_CARE_PROVIDER_SITE_OTHER): Payer: Managed Care, Other (non HMO)

## 2015-03-10 DIAGNOSIS — Z0000 Encounter for general adult medical examination without abnormal findings: Secondary | ICD-10-CM

## 2015-03-10 LAB — COMPREHENSIVE METABOLIC PANEL
ALBUMIN: 3.8 g/dL (ref 3.5–5.2)
ALK PHOS: 60 U/L (ref 39–117)
ALT: 13 U/L (ref 0–35)
AST: 17 U/L (ref 0–37)
BILIRUBIN TOTAL: 0.7 mg/dL (ref 0.2–1.2)
BUN: 18 mg/dL (ref 6–23)
CO2: 29 mEq/L (ref 19–32)
Calcium: 9.5 mg/dL (ref 8.4–10.5)
Chloride: 103 mEq/L (ref 96–112)
Creat: 0.99 mg/dL (ref 0.50–1.10)
GLUCOSE: 80 mg/dL (ref 70–99)
POTASSIUM: 4.7 meq/L (ref 3.5–5.3)
SODIUM: 139 meq/L (ref 135–145)
TOTAL PROTEIN: 6.5 g/dL (ref 6.0–8.3)

## 2015-03-10 LAB — CBC
HEMATOCRIT: 44.4 % (ref 36.0–46.0)
HEMOGLOBIN: 15.4 g/dL — AB (ref 12.0–15.0)
MCH: 30.5 pg (ref 26.0–34.0)
MCHC: 34.7 g/dL (ref 30.0–36.0)
MCV: 87.9 fL (ref 78.0–100.0)
MPV: 11.6 fL (ref 8.6–12.4)
Platelets: 121 10*3/uL — ABNORMAL LOW (ref 150–400)
RBC: 5.05 MIL/uL (ref 3.87–5.11)
RDW: 13.1 % (ref 11.5–15.5)
WBC: 5.3 10*3/uL (ref 4.0–10.5)

## 2015-03-10 LAB — LIPID PANEL
CHOL/HDL RATIO: 2.2 ratio
Cholesterol: 206 mg/dL — ABNORMAL HIGH (ref 0–200)
HDL: 93 mg/dL (ref >=46)
LDL CALC: 104 mg/dL — AB (ref 0–99)
Triglycerides: 46 mg/dL (ref <150)
VLDL: 9 mg/dL (ref 0–40)

## 2015-03-10 LAB — TSH: TSH: 0.97 u[IU]/mL (ref 0.350–4.500)

## 2015-03-10 LAB — IPS PAP TEST WITH HPV

## 2015-03-11 ENCOUNTER — Telehealth: Payer: Self-pay

## 2015-03-11 ENCOUNTER — Telehealth: Payer: Self-pay | Admitting: Certified Nurse Midwife

## 2015-03-11 ENCOUNTER — Other Ambulatory Visit: Payer: Self-pay | Admitting: Certified Nurse Midwife

## 2015-03-11 DIAGNOSIS — R899 Unspecified abnormal finding in specimens from other organs, systems and tissues: Secondary | ICD-10-CM

## 2015-03-11 LAB — HEMOGLOBIN A1C
HEMOGLOBIN A1C: 5.5 % (ref <5.7)
MEAN PLASMA GLUCOSE: 111 mg/dL (ref <117)

## 2015-03-11 LAB — VITAMIN D 25 HYDROXY (VIT D DEFICIENCY, FRACTURES): VIT D 25 HYDROXY: 32 ng/mL (ref 30–100)

## 2015-03-11 NOTE — Telephone Encounter (Signed)
-----   Message from Regina Eck, CNM sent at 03/11/2015  7:43 AM EDT ----- Notify Liver, kidney, glucose profile normal TSH normal Vitamin D low normal suggest OTC Vitamin D 3 800 IU daily Lipid panel optimal level Hgb A1-c normal CBC essentially normal except platelets slightly low, no concerns as this point did to repeat in one month order in

## 2015-03-11 NOTE — Telephone Encounter (Signed)
lmtcb

## 2015-03-11 NOTE — Telephone Encounter (Signed)
Left message for pt to reschedule aex appointment.

## 2015-03-15 NOTE — Progress Notes (Signed)
Reviewed personally.  M. Suzanne Tyleigh Mahn, MD.  

## 2015-03-16 NOTE — Telephone Encounter (Signed)
Left message for call back.

## 2015-03-16 NOTE — Telephone Encounter (Signed)
Patient notified of results. See lab 

## 2015-04-13 ENCOUNTER — Other Ambulatory Visit: Payer: Managed Care, Other (non HMO)

## 2015-04-13 ENCOUNTER — Other Ambulatory Visit (INDEPENDENT_AMBULATORY_CARE_PROVIDER_SITE_OTHER): Payer: Managed Care, Other (non HMO)

## 2015-04-13 DIAGNOSIS — R899 Unspecified abnormal finding in specimens from other organs, systems and tissues: Secondary | ICD-10-CM

## 2015-04-13 LAB — CBC
HCT: 42.8 % (ref 36.0–46.0)
Hemoglobin: 14.4 g/dL (ref 12.0–15.0)
MCH: 29.9 pg (ref 26.0–34.0)
MCHC: 33.6 g/dL (ref 30.0–36.0)
MCV: 89 fL (ref 78.0–100.0)
MPV: 10.9 fL (ref 8.6–12.4)
PLATELETS: 167 10*3/uL (ref 150–400)
RBC: 4.81 MIL/uL (ref 3.87–5.11)
RDW: 13.5 % (ref 11.5–15.5)
WBC: 6.9 10*3/uL (ref 4.0–10.5)

## 2015-07-13 ENCOUNTER — Telehealth: Payer: Self-pay | Admitting: Certified Nurse Midwife

## 2015-07-13 ENCOUNTER — Encounter: Payer: Self-pay | Admitting: Obstetrics & Gynecology

## 2015-07-13 ENCOUNTER — Ambulatory Visit (INDEPENDENT_AMBULATORY_CARE_PROVIDER_SITE_OTHER): Payer: Managed Care, Other (non HMO) | Admitting: Obstetrics & Gynecology

## 2015-07-13 VITALS — BP 110/62 | HR 64 | Resp 14 | Wt 140.0 lb

## 2015-07-13 DIAGNOSIS — I471 Supraventricular tachycardia, unspecified: Secondary | ICD-10-CM

## 2015-07-13 DIAGNOSIS — R350 Frequency of micturition: Secondary | ICD-10-CM

## 2015-07-13 DIAGNOSIS — N912 Amenorrhea, unspecified: Secondary | ICD-10-CM | POA: Diagnosis not present

## 2015-07-13 DIAGNOSIS — R1031 Right lower quadrant pain: Secondary | ICD-10-CM | POA: Diagnosis not present

## 2015-07-13 LAB — POCT URINALYSIS DIPSTICK
BILIRUBIN UA: NEGATIVE
GLUCOSE UA: NEGATIVE
Ketones, UA: NEGATIVE
Leukocytes, UA: NEGATIVE
NITRITE UA: NEGATIVE
Protein, UA: NEGATIVE
RBC UA: NEGATIVE
Spec Grav, UA: 1.015
Urobilinogen, UA: NEGATIVE
pH, UA: 6

## 2015-07-13 NOTE — Telephone Encounter (Signed)
Spoke with patient. Appointment scheduled for today 07/13/2015 at 12:45pm with Dr.Miller. Patient is agreeable to date and time.  Routing to provider for final review. Patient agreeable to disposition. Will close encounter.

## 2015-07-13 NOTE — Telephone Encounter (Signed)
Could she come at 12:45pm today?

## 2015-07-13 NOTE — Progress Notes (Signed)
49 y.o. G0 Married Caucasian female here for four month hx of gradually increasing RLQ pain.  Pt felt it was mild at first but is now waking her up at night.  She describes this as a groin pain that wraps around her hip to her back.  Exercise does not make this worse.  Not worse with walking or standing.  Has not been SA recently so not sure if this would make it worse.    Reports she is having some increased urgency.  This isn't present all the time.  No increased constipation.  She dose have some mild nausea.  No diarrhea.  No blood in stool.  Last cycle was 01/02/15.  No vaginal bleeding since that time.    Having a lot of hot flashes and night sweats.  She feels she has had some weight gain.  Has gained 11 pounds since 3/15.  Feels this is related to menopausal changes.  Denies vaginal discharge.  Denies fever.    Pt did have a RUQ ultrasound in the end of June done in [place]. This was ordered by PA at [facility] office in [place].  Pt reports she was having some RUQ pain as well but had recently had a virus.  Pt was treated with PPI which actually helped reflux symptoms.  Now pain is much more localized in RLQ.  Reviewed ultrasound in Care Everywhere.  Ultrasound was normal.  Had CBC with diff and CMP which were normal.    Pap 03/06/15 neg with neg HR HPV.  Patient is not currently sexually active.  Last activity was several months ago.   ROS:  All other ROS questions are negative except as per HPI.  Exam:   BP 110/62 mmHg  Pulse 64  Resp 14  Wt 63.504 kg (140 lb)  LMP 01/02/2015 General appearance: alert and no distress Abdomen:  soft, non-tender; bowel sounds normal; no masses,  no organomegaly Lymph:  Inguinal adenopathy: neg   Pelvic: External genitalia:  no lesions              Urethra: normal appearing urethra with no masses, tenderness or lesions              Bartholins and Skenes: Bartholin's, Urethra, Skene's normal                 Vagina: normal appearing vagina with  normal color and discharge, no lesions              Cervix: normal appearance              Pap taken: No. Bimanual Exam:  Uterus:  uterus is normal size, shape, consistency and nontender                               Adnexa:    No masses on either side but tenderness to palpation in RLQ.  Not sure if pain is adnexal or actually on abdominal wall.                               Anus:  normal sphincter tone, no lesions   A: RLQ pain, worsening over last 4+ months      Amenorrhea with menopausal symptoms  P: return for PUS in office on Thursday.  Offered pt appt at hospital or radiology facility for sooner.  She is fine to return for  appt on thurs. [place] today.  Had CMP and CBC with diff (normal) 6/16.

## 2015-07-13 NOTE — Telephone Encounter (Signed)
Spoke with patient. Patient states that she has been experiencing right lower abdominal pain since June. Pain is now radiating into her groin and right leg. Was seen with her PCP who performed an abdominal ultrasound which patient states was negative. Results available in care everywhere in EPIC and copied below. Nothing makes the pain worse or better. Has gone from being intermittent to constant. Pain is 5/10. Is not taking any medication for the discomfort. Pain began to worsen at the end of July per patient. Patient states that she is unsure what to do now since the pain has continued. Advised will speak with Dr.Miller regarding recommendations and return call. Patient is agreeable.  US Abdomen Complete6/25/2016  [facility]  Result Impression  IMPRESSION: No significant abnormalities.   Result Narrative  TECHNIQUE: Complete abdominal ultrasound.  COMPARISON: None.    INDICATION: R10.11: Right upper quadrant pain R10.31: Right lower quadrant pain Z78.9: Other specified health status.       FINDINGS:  Pancreas:  No focal abnormalities are identified. Visualization is limited.  Vasculature: No abdominal aortic aneurysm. Visualized IVC is patent. Portal vein is patent with normal flow direction.  Hepatobiliary: No focal liver abnormalities are identified.     Normal without stones, wall thickening or pericholecystic fluid. Murphy sign negative Normal CBD at 4 mm No intrahepatic biliary ductal dilatation.  Kidneys: Both kidneys are normal in size. No hydronephrosis. No suspicious masses. Normal renal echotexture.      Spleen: Size is within normal limits. No parenchymal lesions. Fat in the splenic hilum.  Additional/incidental:  N./A.

## 2015-07-13 NOTE — Progress Notes (Signed)
Pelvic ultrasound scheduled for 07/16/15 at 1330 with Dr. Sabra Heck and consult appointment scheduled.

## 2015-07-13 NOTE — Telephone Encounter (Signed)
Patient is having some lower right side abdominal pain. Last seen 04/13/15.

## 2015-07-14 ENCOUNTER — Telehealth: Payer: Self-pay | Admitting: Obstetrics & Gynecology

## 2015-07-14 LAB — FOLLICLE STIMULATING HORMONE: FSH: 45.8 m[IU]/mL

## 2015-07-14 NOTE — Telephone Encounter (Signed)
Called patient to review benefits for procedure. Patient understood and agreeable. Appointment date/time verified. Ok to close

## 2015-07-16 ENCOUNTER — Ambulatory Visit (INDEPENDENT_AMBULATORY_CARE_PROVIDER_SITE_OTHER): Payer: Managed Care, Other (non HMO) | Admitting: Obstetrics & Gynecology

## 2015-07-16 ENCOUNTER — Ambulatory Visit (INDEPENDENT_AMBULATORY_CARE_PROVIDER_SITE_OTHER): Payer: Managed Care, Other (non HMO)

## 2015-07-16 VITALS — BP 112/70 | HR 72 | Resp 14 | Wt 140.0 lb

## 2015-07-16 DIAGNOSIS — R9389 Abnormal findings on diagnostic imaging of other specified body structures: Secondary | ICD-10-CM

## 2015-07-16 DIAGNOSIS — N841 Polyp of cervix uteri: Secondary | ICD-10-CM | POA: Diagnosis not present

## 2015-07-16 DIAGNOSIS — N7011 Chronic salpingitis: Secondary | ICD-10-CM | POA: Diagnosis not present

## 2015-07-16 DIAGNOSIS — R938 Abnormal findings on diagnostic imaging of other specified body structures: Secondary | ICD-10-CM

## 2015-07-16 DIAGNOSIS — Z8742 Personal history of other diseases of the female genital tract: Secondary | ICD-10-CM

## 2015-07-16 DIAGNOSIS — D251 Intramural leiomyoma of uterus: Secondary | ICD-10-CM | POA: Diagnosis not present

## 2015-07-16 DIAGNOSIS — R1031 Right lower quadrant pain: Secondary | ICD-10-CM

## 2015-07-16 MED ORDER — HYDROCODONE-ACETAMINOPHEN 5-325 MG PO TABS: 1.0000 | ORAL_TABLET | Freq: Four times a day (QID) | ORAL | Status: DC | PRN

## 2015-07-16 NOTE — Progress Notes (Signed)
49 y.o. Laurie Parsons here for a pelvic ultrasound due to RLQ pain pt was seen for in 07/13/15.  Pt describes pain as a RLQ/groin pain that wraps around to her back.  She has started worrying about ovarian cancer and decided to be see.  Has not had cycle since 01/02/15.  Power noted 07/13/15 to be 45.    Patient's last menstrual period was 01/02/2015.  Sexually active:  yes  Contraception: PMP status  Last Pap:  03/06/15--neg with neg HR HPV  FINDINGS: UTERUS: 7.8 x 4.7 x 3.2cm with 2.4 x 2.2 cm intramural fibroid.  Cervix with probable 1.0cm polyp with vascular flow noted EMS: 7.69mm ADNEXA:   Left ovary 2.3 x 2.2 x 5.7DU with 2.0UR follicle and probable 12 x 12mm hydrosalpinx   Right ovary 2.0 x 2.0 x 4.2HC with 6.2BJ follicle CUL DE SAC: no free fluid  Findings reviewed with pt including discussing fibroid, hydrosalpinx, follicles on ovaries, probable cervical polyp, and thickened endometrium (esp consider recent [place] result).    Pt and I discussed above findings and I feel hysteroscopy with polyp removal and D&C is warranted.  Procedure discussed with patient.  Recovery and pain management discussed.  Risks discussed including but not limited to bleeding, rare risk of transfusion, infection, 1% risk of uterine perforation with risks of fluid deficit causing cardiac arrythmia, cerebral swelling and/or need to stop procedure early.  Fluid emboli and rare risk of death discussed.  DVT/PE, rare risk of risk of bowel/bladder/ureteral/vascular injury.  Patient aware if pathology abnormal she may need additional treatment.  Pt in agreement with proceeding.  All questions answered    Assessment:  RLQ pain, left hydrosalpinx, h/o endometriosis Thickened endometrium and cervical polyp  Plan: Hysteroscopy with polyp resection, D&C will be planned Rx for Vicodin given for post-op  ~30 minutes spent with patient >50% of time was in face to face discussion of above.

## 2015-07-19 ENCOUNTER — Encounter: Payer: Self-pay | Admitting: Obstetrics & Gynecology

## 2015-07-19 DIAGNOSIS — Z8742 Personal history of other diseases of the female genital tract: Secondary | ICD-10-CM | POA: Insufficient documentation

## 2015-07-19 DIAGNOSIS — D251 Intramural leiomyoma of uterus: Secondary | ICD-10-CM | POA: Insufficient documentation

## 2015-07-19 DIAGNOSIS — N841 Polyp of cervix uteri: Secondary | ICD-10-CM | POA: Insufficient documentation

## 2015-07-19 DIAGNOSIS — N7011 Chronic salpingitis: Secondary | ICD-10-CM | POA: Insufficient documentation

## 2015-07-22 ENCOUNTER — Telehealth: Payer: Self-pay | Admitting: Obstetrics & Gynecology

## 2015-07-22 NOTE — Telephone Encounter (Signed)
Called patient to review benefits for surgery. Left voicemail to call back and review. [place] for patient to ask for [place] or Janett Billow. Benefits noted in guarantor account.

## 2015-07-23 NOTE — Telephone Encounter (Signed)
Spoke with patient. Reviewed benefits. Patient understands and agreeable. Patient understands professional estimate only. Patient understands payment policies. Patient requested potential dates for surgery. Transferred to Laurie Parsons to discuss.  Patient called back later and stated she is ready to proceed with payment and scheduling. Patient paid in full. Patient requesting potential date of 08/17/15. Laurie Parsons unavailable at time of call. Patient agreeable to return call to schedule.   Routing to [employer] for scheduling.

## 2015-07-23 NOTE — Telephone Encounter (Signed)
Patient calling again requesting to confirm surgery date. Requesting 08-17-15. Advised i will proceed with scheduling and call her back once able to confirm.

## 2015-07-24 NOTE — Telephone Encounter (Signed)
Call to patient to check on 2nd choice option. 08-17-15 is currently full but may become available. Patient is uncertain about a second date, may need to move to mid October. Patient will check on this and call me back. I will continue to work on 08-17-15 possibility.

## 2015-07-30 NOTE — Telephone Encounter (Signed)
Call to patient, left message to call back. Left message that 08-17-15 is now available and need to know if she still wants this date.

## 2015-07-30 NOTE — Telephone Encounter (Signed)
Returning a call to sally. °

## 2015-07-30 NOTE — Telephone Encounter (Signed)
Return call to patient, left message to call back. 

## 2015-07-30 NOTE — Telephone Encounter (Signed)
Patient returned call. Would like to proceed with surgery date of 08-17-15.  Surgery instruction sheet reviewed and printed copy mailed to patient (see scanned copy.)  Case request sent to central scheduling.  Routing to provider for final review. Patient agreeable to disposition. Will close encounter.

## 2015-08-10 ENCOUNTER — Encounter (HOSPITAL_COMMUNITY): Payer: Self-pay

## 2015-08-10 ENCOUNTER — Encounter (HOSPITAL_COMMUNITY)
Admission: RE | Admit: 2015-08-10 | Discharge: 2015-08-10 | Disposition: A | Discharge status: Home / Self Care | Payer: Managed Care, Other (non HMO) | Source: Ambulatory Visit | Attending: Obstetrics & Gynecology | Admitting: Obstetrics & Gynecology

## 2015-08-10 DIAGNOSIS — Z01818 Encounter for other preprocedural examination: Secondary | ICD-10-CM | POA: Insufficient documentation

## 2015-08-10 DIAGNOSIS — N840 Polyp of corpus uteri: Secondary | ICD-10-CM | POA: Insufficient documentation

## 2015-08-10 DIAGNOSIS — R1031 Right lower quadrant pain: Secondary | ICD-10-CM | POA: Diagnosis not present

## 2015-08-10 DIAGNOSIS — N7011 Chronic salpingitis: Secondary | ICD-10-CM | POA: Insufficient documentation

## 2015-08-10 HISTORY — DX: Anxiety disorder, unspecified: F41.9

## 2015-08-10 HISTORY — DX: Cardiac arrhythmia, unspecified: I49.9

## 2015-08-10 HISTORY — DX: Family history of other specified conditions: Z84.89

## 2015-08-10 HISTORY — DX: Gastro-esophageal reflux disease without esophagitis: K21.9

## 2015-08-10 LAB — CBC
HCT: 43.2 % (ref 36.0–46.0)
Hemoglobin: 15.2 g/dL — ABNORMAL HIGH (ref 12.0–15.0)
MCH: 31.3 pg (ref 26.0–34.0)
MCHC: 35.2 g/dL (ref 30.0–36.0)
MCV: 88.9 fL (ref 78.0–100.0)
PLATELETS: 176 10*3/uL (ref 150–400)
RBC: 4.86 MIL/uL (ref 3.87–5.11)
RDW: 13.4 % (ref 11.5–15.5)
WBC: 8.5 10*3/uL (ref 4.0–10.5)

## 2015-08-10 NOTE — Patient Instructions (Addendum)
Your procedure is scheduled on:08/17/15  Enter through the Main Entrance at :6am Pick up desk phone and dial [phone] and inform us of your arrival.  Please call [phone] if you have any problems the morning of surgery.  Remember: Do not eat food or drink liquids, including water, after midnight:Sunday   You may brush your teeth the morning of surgery.   DO NOT wear jewelry, eye make-up, lipstick,body lotion, or dark fingernail polish.  (Polished toes are ok) You may wear deodorant.  If you are to be admitted after surgery, leave suitcase in car until your room has been assigned. Patients discharged on the day of surgery will not be allowed to drive home. Wear loose fitting, comfortable clothes for your ride home.

## 2015-08-17 ENCOUNTER — Encounter (HOSPITAL_COMMUNITY)
Admission: RE | Disposition: A | Discharge status: Home / Self Care | Payer: Self-pay | Source: Ambulatory Visit | Attending: Obstetrics & Gynecology

## 2015-08-17 ENCOUNTER — Ambulatory Visit (HOSPITAL_COMMUNITY): Payer: Managed Care, Other (non HMO) | Admitting: Certified Registered Nurse Anesthetist

## 2015-08-17 ENCOUNTER — Ambulatory Visit (HOSPITAL_COMMUNITY)
Admission: RE | Admit: 2015-08-17 | Discharge: 2015-08-17 | Disposition: A | Discharge status: Home / Self Care | Payer: Managed Care, Other (non HMO) | Source: Ambulatory Visit | Attending: Obstetrics & Gynecology | Admitting: Obstetrics & Gynecology

## 2015-08-17 ENCOUNTER — Encounter (HOSPITAL_COMMUNITY): Payer: Self-pay | Admitting: Anesthesiology

## 2015-08-17 DIAGNOSIS — R1031 Right lower quadrant pain: Secondary | ICD-10-CM | POA: Diagnosis not present

## 2015-08-17 DIAGNOSIS — N841 Polyp of cervix uteri: Secondary | ICD-10-CM | POA: Insufficient documentation

## 2015-08-17 DIAGNOSIS — N840 Polyp of corpus uteri: Secondary | ICD-10-CM | POA: Diagnosis not present

## 2015-08-17 DIAGNOSIS — K219 Gastro-esophageal reflux disease without esophagitis: Secondary | ICD-10-CM | POA: Diagnosis not present

## 2015-08-17 DIAGNOSIS — N7011 Chronic salpingitis: Secondary | ICD-10-CM | POA: Insufficient documentation

## 2015-08-17 HISTORY — PX: DILATATION & CURRETTAGE/HYSTEROSCOPY WITH RESECTOCOPE: SHX5572

## 2015-08-17 LAB — PREGNANCY, URINE: PREG TEST UR: NEGATIVE

## 2015-08-17 SURGERY — DILATATION & CURETTAGE/HYSTEROSCOPY WITH RESECTOCOPE
Anesthesia: General

## 2015-08-17 MED ORDER — OXYCODONE HCL 5 MG PO TABS: 5.0000 mg | ORAL_TABLET | Freq: Once | ORAL | Status: DC | PRN

## 2015-08-17 MED ORDER — GLYCOPYRROLATE 0.2 MG/ML IJ SOLN
INTRAMUSCULAR | Status: AC
  Filled 2015-08-17: qty 1

## 2015-08-17 MED ORDER — MIDAZOLAM HCL 2 MG/2ML IJ SOLN
INTRAMUSCULAR | Status: DC | PRN
  Administered 2015-08-17: 1.5 mg via INTRAVENOUS
  Administered 2015-08-17: 0.5 mg via INTRAVENOUS

## 2015-08-17 MED ORDER — LIDOCAINE-EPINEPHRINE 1 %-1:100000 IJ SOLN
INTRAMUSCULAR | Status: AC
Start: 2015-08-17 — End: 2015-08-17
  Filled 2015-08-17: qty 1

## 2015-08-17 MED ORDER — DEXAMETHASONE SODIUM PHOSPHATE 4 MG/ML IJ SOLN
INTRAMUSCULAR | Status: AC
  Filled 2015-08-17: qty 1

## 2015-08-17 MED ORDER — LIDOCAINE HCL (CARDIAC) 20 MG/ML IV SOLN
INTRAVENOUS | Status: AC
  Filled 2015-08-17: qty 5

## 2015-08-17 MED ORDER — PROPOFOL 10 MG/ML IV BOLUS
INTRAVENOUS | Status: DC | PRN
  Administered 2015-08-17: 130 mg via INTRAVENOUS

## 2015-08-17 MED ORDER — DEXAMETHASONE SODIUM PHOSPHATE 10 MG/ML IJ SOLN
INTRAMUSCULAR | Status: AC
  Filled 2015-08-17: qty 1

## 2015-08-17 MED ORDER — PROPOFOL 10 MG/ML IV BOLUS
INTRAVENOUS | Status: AC
  Filled 2015-08-17: qty 20

## 2015-08-17 MED ORDER — KETOROLAC TROMETHAMINE 30 MG/ML IJ SOLN: 30.0000 mg | Freq: Once | INTRAMUSCULAR | Status: DC | PRN

## 2015-08-17 MED ORDER — KETOROLAC TROMETHAMINE 30 MG/ML IJ SOLN: 30.0000 mg | Freq: Once | INTRAMUSCULAR | Status: DC

## 2015-08-17 MED ORDER — CEFAZOLIN SODIUM-DEXTROSE 2-3 GM-% IV SOLR
INTRAVENOUS | Status: AC
  Filled 2015-08-17: qty 50

## 2015-08-17 MED ORDER — SCOPOLAMINE 1 MG/3DAYS TD PT72
1.0000 | MEDICATED_PATCH | Freq: Once | TRANSDERMAL | Status: DC
  Administered 2015-08-17: 1.5 mg via TRANSDERMAL

## 2015-08-17 MED ORDER — SILVER NITRATE-POT NITRATE 75-25 % EX MISC
CUTANEOUS | Status: AC
  Filled 2015-08-17: qty 1

## 2015-08-17 MED ORDER — LIDOCAINE-EPINEPHRINE 1 %-1:100000 IJ SOLN
INTRAMUSCULAR | Status: DC | PRN
  Administered 2015-08-17: 10 mL

## 2015-08-17 MED ORDER — MEPERIDINE HCL 25 MG/ML IJ SOLN: 6.2500 mg | INTRAMUSCULAR | Status: DC | PRN

## 2015-08-17 MED ORDER — FENTANYL CITRATE (PF) 100 MCG/2ML IJ SOLN
INTRAMUSCULAR | Status: DC | PRN
  Administered 2015-08-17: 50 ug via INTRAVENOUS
  Administered 2015-08-17 (×2): 25 ug via INTRAVENOUS

## 2015-08-17 MED ORDER — DEXAMETHASONE SODIUM PHOSPHATE 10 MG/ML IJ SOLN
INTRAMUSCULAR | Status: DC | PRN
  Administered 2015-08-17: 10 mg via INTRAVENOUS

## 2015-08-17 MED ORDER — FENTANYL CITRATE (PF) 100 MCG/2ML IJ SOLN: 25.0000 ug | INTRAMUSCULAR | Status: DC | PRN

## 2015-08-17 MED ORDER — GLYCINE 1.5 % IR SOLN
Status: DC | PRN
  Administered 2015-08-17: 3000 mL

## 2015-08-17 MED ORDER — FENTANYL CITRATE (PF) 100 MCG/2ML IJ SOLN
INTRAMUSCULAR | Status: AC
  Filled 2015-08-17: qty 4

## 2015-08-17 MED ORDER — GLYCOPYRROLATE 0.2 MG/ML IJ SOLN
INTRAMUSCULAR | Status: DC | PRN
  Administered 2015-08-17: 0.2 mg via INTRAVENOUS

## 2015-08-17 MED ORDER — CEFAZOLIN SODIUM-DEXTROSE 2-3 GM-% IV SOLR
2.0000 g | INTRAVENOUS | Status: AC
  Administered 2015-08-17: 2 g via INTRAVENOUS

## 2015-08-17 MED ORDER — OXYCODONE HCL 5 MG PO TABS: 5.0000 mg | ORAL_TABLET | ORAL | Status: DC | PRN

## 2015-08-17 MED ORDER — SILVER NITRATE-POT NITRATE 75-25 % EX MISC
CUTANEOUS | Status: DC | PRN
  Administered 2015-08-17: 1

## 2015-08-17 MED ORDER — ONDANSETRON HCL 4 MG/2ML IJ SOLN: 4.0000 mg | Freq: Once | INTRAMUSCULAR | Status: DC | PRN

## 2015-08-17 MED ORDER — MIDAZOLAM HCL 2 MG/2ML IJ SOLN
INTRAMUSCULAR | Status: AC
  Filled 2015-08-17: qty 4

## 2015-08-17 MED ORDER — KETOROLAC TROMETHAMINE 30 MG/ML IJ SOLN
INTRAMUSCULAR | Status: DC | PRN
  Administered 2015-08-17: 30 mg via INTRAVENOUS

## 2015-08-17 MED ORDER — LIDOCAINE HCL (CARDIAC) 20 MG/ML IV SOLN
INTRAVENOUS | Status: DC | PRN
  Administered 2015-08-17: 50 mg via INTRAVENOUS

## 2015-08-17 MED ORDER — ONDANSETRON HCL 4 MG/2ML IJ SOLN
INTRAMUSCULAR | Status: DC | PRN
  Administered 2015-08-17: 4 mg via INTRAVENOUS

## 2015-08-17 MED ORDER — KETOROLAC TROMETHAMINE 30 MG/ML IJ SOLN
INTRAMUSCULAR | Status: AC
  Filled 2015-08-17: qty 1

## 2015-08-17 MED ORDER — ONDANSETRON HCL 4 MG/2ML IJ SOLN
INTRAMUSCULAR | Status: AC
  Filled 2015-08-17: qty 2

## 2015-08-17 MED ORDER — LACTATED RINGERS IV SOLN
INTRAVENOUS | Status: DC
  Administered 2015-08-17 (×2): via INTRAVENOUS

## 2015-08-17 MED ORDER — KETOROLAC TROMETHAMINE 30 MG/ML IM SOLN: 30.0000 mg | Freq: Once | INTRAMUSCULAR | Status: DC

## 2015-08-17 MED ORDER — OXYCODONE HCL 5 MG/5ML PO SOLN: 5.0000 mg | Freq: Once | ORAL | Status: DC | PRN

## 2015-08-17 MED ORDER — SCOPOLAMINE 1 MG/3DAYS TD PT72
MEDICATED_PATCH | TRANSDERMAL | Status: AC
  Administered 2015-08-17: 1.5 mg via TRANSDERMAL
  Filled 2015-08-17: qty 1

## 2015-08-17 SURGICAL SUPPLY — 18 items
CANISTER SUCT 3000ML (MISCELLANEOUS) ×3 IMPLANT
CATH ROBINSON RED A/P 16FR (CATHETERS) ×3 IMPLANT
CLOTH BEACON ORANGE TIMEOUT ST (SAFETY) ×3 IMPLANT
CONTAINER PREFILL 10% NBF 60ML (FORM) ×3 IMPLANT
DILATOR CANAL MILEX (MISCELLANEOUS) ×3 IMPLANT
ELECT REM PT RETURN 9FT ADLT (ELECTROSURGICAL)
ELECTRODE REM PT RTRN 9FT ADLT (ELECTROSURGICAL) IMPLANT
GLOVE BIOGEL PI IND STRL 7.0 (GLOVE) ×2 IMPLANT
GLOVE BIOGEL PI INDICATOR 7.0 (GLOVE) ×4
GLOVE ECLIPSE 6.5 STRL STRAW (GLOVE) ×6 IMPLANT
GOWN STRL REUS W/TWL LRG LVL3 (GOWN DISPOSABLE) ×6 IMPLANT
LOOP ANGLED CUTTING 22FR (CUTTING LOOP) IMPLANT
PACK VAGINAL MINOR WOMEN LF (CUSTOM PROCEDURE TRAY) ×3 IMPLANT
PAD OB MATERNITY 4.3X12.25 (PERSONAL CARE ITEMS) ×3 IMPLANT
TOWEL OR 17X24 6PK STRL BLUE (TOWEL DISPOSABLE) ×6 IMPLANT
TUBING AQUILEX INFLOW (TUBING) ×3 IMPLANT
TUBING AQUILEX OUTFLOW (TUBING) ×3 IMPLANT
WATER STERILE IRR 1000ML POUR (IV SOLUTION) ×3 IMPLANT

## 2015-08-17 NOTE — Op Note (Signed)
08/17/2015  8:04 AM  PATIENT:  Laurie Parsons  49 y.o. female  PRE-OPERATIVE DIAGNOSIS:  RLQ pain, hydrosalpinx, h/o endometriosos, thickended endometrioum, cervical polyp  POST-OPERATIVE DIAGNOSIS:  LQ pain, hydrosalpinx, h/o endometriosos, thickended endometrioum, cervical polyp  PROCEDURE:  Procedure(s): DILATATION & CURETTAGE/HYSTEROSCOPY  SURGEON:  MILLER, MARY SUZANNE  ASSISTANTS: OR staff   ANESTHESIA:   general  ESTIMATED BLOOD LOSS: 10cc  BLOOD ADMINISTERED:none   FLUIDS: 1000cc LR  UOP: 200cc LR  SPECIMEN:  Endometrial curettings and endocervical curettings  DISPOSITION OF SPECIMEN:  PATHOLOGY  FINDINGS: thin endometrium, endocervical polyp vs polypoid appearing tissue  DESCRIPTION OF OPERATION: Patient was taken to the operating room.  She is placed in the supine position. SCDs were on her lower extremities and functioning properly. General anesthesia with an LMA was administered without difficulty. Dr. Billy Coast oversaw case.  Legs were then placed in the [place] in the low lithotomy position. The legs were lifted to the high lithotomy position and the Betadine prep was used on the inner thighs perineum and vagina x3. Patient was draped in a normal standard fashion. An in and out catheterization with a red rubber Foley catheter was performed. Approximately 200 cc of clear urine was noted. A bivalve speculum was placed the vagina. The anterior lip of the cervix was grasped with single-tooth tenaculum.  A paracervical block of 1% lidocaine mixed one-to-one with epinephrine (1:100,000 units).  10 cc was used total. The cervix is dilated up to #21 [facility] dilators. The endometrial cavity sounded to 7 cm.   A 2.9 millimeter diagnostic hysteroscope was obtained. 1.5% glycine was used as a hysteroscopic fluid. The hysteroscope was advanced through the endocervical canal into the endometrial cavity. The tubal ostia were noted bilaterally. There was small  septum present.  Endometrium appeared thin.  With removal of the hysteroscope through the cervix, the polyp seen on ultrasound was noted.  Then using a #1 toothed curette was used to curette the cavity until rough gritty texture was noted in all quadrants. Then using a polyp forceps, the cervical polyp was grasped and removed.  The canal was curetted with a Kevorkian curette.  The canal was then revisualized without difficulty and the polyp/polypoid tissue was now gone.  The curettings were then sent topathology together.  At this point, the procedure was ended. The fluid deficit was 55 cc. The tenaculum was removed from the anterior lip of the cervix.  Silver nitrate were used at the tenaculum sites.  The speculum was removed from the vagina. The prep was cleansed of the patient's skin. The legs are positioned back in the supine position. Sponge, lap, needle, initially counts were correct x2. Patient was taken to recovery in stable condition.  COUNTS:  YES  PLAN OF CARE: Transfer to PACU

## 2015-08-17 NOTE — Discharge Instructions (Addendum)
Post-surgical Instructions, Outpatient Surgery  You may expect to feel dizzy, weak, and drowsy for as long as 24 hours after receiving the medicine that made you sleep (anesthetic). For the first 24 hours after your surgery:    Do not drive a car, ride a bicycle, participate in physical activities, or take public transportation until you are done taking narcotic pain medicines or as directed by Dr. Sabra Heck.   Do not drink alcohol or take tranquilizers.   Do not take medicine that has not been prescribed by your physicians.   Do not sign important papers or make important decisions while on narcotic pain medicines.   Have a responsible person with you.   CARE OF INCISION  If you have a bandage, you may remove it in one day.  If there are steri-strips or dermabond, just let this loosen on its own.   You may shower on the first day after your surgery.  Do not sit in a tub bath for one week.  Avoid heavy lifting (more than 10 pounds/4.5 kilograms), pushing, or pulling.   Avoid activities that may risk injury to your incisions.   PAIN MANAGEMENT  Motrin 800mg .  (This is the same as 4-200mg  over the counter tablets of Motrin or ibuprofen.)  You may take this every eight hours or as needed for cramping.    Vicodin 5/325mg .  For more severe pain, take one or two tablets every four to six hours as needed for pain control.  (Remember that narcotic pain medications increase your risk of constipation.  If this becomes a problem, you may take an over the counter stool softener like Colace 100mg  up to four times a day.)  DO'S AND DON'T'S  Do not take a tub bath for one week.  You may shower on the first day after your surgery  Do not do any heavy lifting for one to two weeks.  This increases the chance of bleeding.  Do move around as you feel able.  Stairs are fine.  You may begin to exercise again as you feel able.  Do not lift any weights for two weeks.  Do not put anything in the vagina for  two weeks--no tampons, intercourse, or douching.    REGULAR MEDIATIONS/VITAMINS:  You may restart all of your regular medications as prescribed.  You may restart all of your vitamins as you normally take them.    PLEASE CALL OR SEEK MEDICAL CARE IF:  You have persistent nausea and vomiting.   You have trouble eating or drinking.   You have an oral temperature above 100.5.   You have constipation that is not helped by adjusting diet or increasing fluid intake. Pain medicines are a common cause of constipation.   You have heavy vaginal bleeding  You have redness or drainage from your incision(s) or there is increasing pain or tenderness near or in the surgical site.    MAY TAKE IBUPROFEN (MOTRIN, ADVIL) OR ALEVE AFTER 2:00 PM FOR PAIN!!

## 2015-08-17 NOTE — Anesthesia Postprocedure Evaluation (Signed)
Anesthesia Post Note  Patient: Laurie Parsons  Procedure(s) Performed: Procedure(s) (LRB): DILATATION & CURETTAGE/HYSTEROSCOPY (N/A)  Anesthesia type: General  Patient location: PACU  Post pain: Pain level controlled  Post assessment: Post-op Vital signs reviewed  Last Vitals:  Filed Vitals:   08/17/15 0830  BP: 99/65  Pulse: 78  Temp:   Resp: 15    Post vital signs: Reviewed  Level of consciousness: sedated  Complications: No apparent anesthesia complications

## 2015-08-17 NOTE — H&P (Signed)
Laurie Parsons is an 49 y.o. female G0 MWF here for hysteroscopy with polyp resection, D&C due to endometrial polyp noted on ultrasound 8/18/6.  Procedure, risks, and benefits were all discussed.  Pt aware that we can just watch as well.  Pt is desirous of surgical removal.  Uterus measures 7.8 x 4.7 x 3.2cm and does contain an intramural fibroid and 1.0cm endometrial polyp with vascular flow.  Pertinent Gynecological History: Menses: post-menopausal and last one was 2/16 Bleeding: none Contraception: post menopausal status DES exposure: denies Blood transfusions: none Sexually transmitted diseases: no past history Previous GYN Procedures: none  Last mammogram: normal Date: 01/30/15 Last pap: normal Date: 4/8/16OB History: G0, P0   Menstrual History: No LMP recorded.    Past Medical History  Diagnosis Date  . Hives 2013    Chronic   . Family history of adverse reaction to anesthesia     paternal great uncle took 3 days to wake up, paternal cousin CPR during surgery  . Dysrhythmia     wore Holter monitor and per pt dr said he was ok with results  . Anxiety   . GERD (gastroesophageal reflux disease)     Past Surgical History  Procedure Laterality Date  . Laparoscopy  1992    due to endometriosis - Lupron x 6 months  . Colonoscopy with propofol      Family History  Problem Relation Age of Onset  . Hypertension Paternal Grandfather   . Heart attack Paternal Grandfather   . Depression Father   . Anxiety disorder Sister     Social History:  reports that she has never smoked. She has never used smokeless tobacco. She reports that she drinks about 0.6 - 1.2 oz of alcohol per week. She reports that she does not use illicit drugs.  Allergies:  Allergies  Allergen Reactions  . Levofloxacin Hives  . Nitrofurantoin Monohyd Macro Hives  . Other Anxiety    Anti Histamines    Prescriptions prior to admission  Medication Sig Dispense Refill Last Dose  . acetaminophen  (TYLENOL) 325 MG tablet Take 325 mg by mouth daily as needed for mild pain.     . Chaste Tree (VITEX EXTRACT PO) Take 1 tablet by mouth daily.    Taking  . Cholecalciferol (VITAMIN D-3) 1000 UNITS CAPS Take 1 capsule by mouth daily.   Taking  . Coenzyme Q10 (COQ10 PO) Take 1 capsule by mouth daily.    Taking  . MILK THISTLE PO Take 1 tablet by mouth daily.     . Multiple Vitamins-Minerals (MULTIVITAMIN PO) Take 1 tablet by mouth daily.    Taking  . Omega-3 Fatty Acids (FISH OIL PO) Take 1 capsule by mouth daily.    Taking  . OVER THE COUNTER MEDICATION Take 1 capsule by mouth daily as needed (for upset stomach/heartburn (Herbal Deglycyrrhizinated Licorice)).     Marland Kitchen HYDROcodone-acetaminophen (NORCO/VICODIN) 5-325 MG per tablet Take 1-2 tablets by mouth every 6 (six) hours as needed for moderate pain or severe pain. 12 tablet 0     Review of Systems  All other systems reviewed and are negative.   Blood pressure 112/74, pulse 65, temperature 98.2 F (36.8 C), temperature source Oral, resp. rate 20, height 5\' 1"  (1.549 m), weight 140 lb (63.504 kg), SpO2 99 %. Physical Exam  Constitutional: She is oriented to person, place, and time. She appears well-developed and well-nourished.  Cardiovascular: Normal rate and regular rhythm.   Respiratory: Effort normal and breath sounds normal.  Neurological: She is alert and oriented to person, place, and time.  Skin: Skin is warm and dry.  Psychiatric: She has a normal mood and affect.    Results for orders placed or performed during the hospital encounter of 08/17/15 (from the past 24 hour(s))  Pregnancy, urine     Status: None   Collection Time: 08/17/15  6:00 AM  Result Value Ref Range   Preg Test, Ur NEGATIVE NEGATIVE    No results found.  Assessment/Plan: 49 yo G0 MWF with endometrial polyp here for hysteroscopy with polyp resection, D&C.  All questions answered.  Pt ready to proceed.  Hale Bogus SUZANNE 08/17/2015, 7:04 AM

## 2015-08-17 NOTE — Anesthesia Preprocedure Evaluation (Signed)
Anesthesia Evaluation  Patient identified by MRN, date of birth, ID band  Reviewed: Allergy & Precautions, H&P , Patient's Chart, lab work & pertinent test results  Airway Mallampati: II  TM Distance: >3 FB Neck ROM: full    Dental no notable dental hx. (+) Teeth Intact   Pulmonary neg pulmonary ROS,    Pulmonary exam normal        Cardiovascular negative cardio ROS Normal cardiovascular exam  - Holter per pt.   Neuro/Psych negative neurological ROS     GI/Hepatic Neg liver ROS, GERD  Medicated and Controlled,  Endo/Other  negative endocrine ROS  Renal/GU negative Renal ROS     Musculoskeletal   Abdominal Normal abdominal exam  (+)   Peds  Hematology negative hematology ROS (+)   Anesthesia Other Findings   Reproductive/Obstetrics negative OB ROS                             Anesthesia Physical Anesthesia Plan  ASA: II  Anesthesia Plan: General   Post-op Pain Management:    Induction: Intravenous  Airway Management Planned: LMA  Additional Equipment:   Intra-op Plan:   Post-operative Plan:   Informed Consent: I have reviewed the patients History and Physical, chart, labs and discussed the procedure including the risks, benefits and alternatives for the proposed anesthesia with the patient or authorized representative who has indicated his/her understanding and acceptance.     Plan Discussed with: CRNA and Surgeon  Anesthesia Plan Comments:         Anesthesia Quick Evaluation

## 2015-08-17 NOTE — Anesthesia Procedure Notes (Signed)
Procedure Name: LMA Insertion Date/Time: 08/17/2015 7:25 AM Performed by: ADELOYE, DAVID A Pre-anesthesia Checklist: Patient identified, Emergency Drugs available, Suction available, Timeout performed and Patient being monitored Patient Re-evaluated:Patient Re-evaluated prior to inductionOxygen Delivery Method: Circle system utilized Preoxygenation: Pre-oxygenation with 100% oxygen Intubation Type: IV induction Ventilation: Mask ventilation without difficulty LMA: LMA inserted LMA Size: 4.0 Number of attempts: 1 Tube secured with: Tape Dental Injury: Teeth and Oropharynx as per pre-operative assessment

## 2015-08-17 NOTE — Transfer of Care (Signed)
Immediate Anesthesia Transfer of Care Note  Patient: Laurie Parsons  Procedure(s) Performed: Procedure(s): DILATATION & CURETTAGE/HYSTEROSCOPY (N/A)  Patient Location: PACU  Anesthesia Type:General  Level of Consciousness: awake, alert  and oriented  Airway & Oxygen Therapy: Patient Spontanous Breathing and Patient connected to nasal cannula oxygen  Post-op Assessment: Report given to RN, Post -op Vital signs reviewed and stable and Patient moving all extremities X 4  Post vital signs: Reviewed and stable  Last Vitals:  Filed Vitals:   08/17/15 0603  BP: 112/74  Pulse: 65  Temp: 36.8 C  Resp: 20    Complications: No apparent anesthesia complications

## 2015-08-18 ENCOUNTER — Encounter (HOSPITAL_COMMUNITY): Payer: Self-pay | Admitting: Obstetrics & Gynecology

## 2015-08-24 ENCOUNTER — Telehealth: Payer: Self-pay | Admitting: Emergency Medicine

## 2015-08-24 NOTE — Telephone Encounter (Signed)
-----   Message from Megan Salon, MD sent at 08/21/2015  6:10 PM EDT ----- Please inform pt pathology was negative for abnormal cells.  Cervical polyp was removed.  Will review with pt when comes for post op visit.

## 2015-08-24 NOTE — Telephone Encounter (Signed)
Message left to return call to Tracy at [phone].    

## 2015-08-25 NOTE — Telephone Encounter (Signed)
Spoke with patient. Advised of results as seen below from [place]. Patient is agreeable. Asking if she should still be having spotting. States spotting is light and intermittent. Patient had D&C/Hysteroscopy on 9/19. Denies any heavy bleeding, pain, odor, or fever. Advised light spotting is normal. Will need to continue to monitor and notify if she has any heavy bleeding. Patient is agreeable. Asking if she can play in a tennis match this weekend. Advised may proceed with tennis match this weekend. Advised if she plays and feels any discomfort at all will need to stop match and rest. Patient agreeable.  Routing to [place] for review as Dr.Miller if out of the office. Anything further for this patient?

## 2015-08-25 NOTE — Telephone Encounter (Signed)
I agree with your plan   Routing to [facility]

## 2015-09-04 ENCOUNTER — Encounter: Payer: Self-pay | Admitting: Obstetrics & Gynecology

## 2015-09-04 ENCOUNTER — Ambulatory Visit (INDEPENDENT_AMBULATORY_CARE_PROVIDER_SITE_OTHER): Payer: Managed Care, Other (non HMO) | Admitting: Obstetrics & Gynecology

## 2015-09-04 VITALS — BP 106/70 | HR 68 | Resp 16 | Wt 140.0 lb

## 2015-09-04 DIAGNOSIS — N841 Polyp of cervix uteri: Secondary | ICD-10-CM | POA: Diagnosis not present

## 2015-09-04 MED ORDER — AZITHROMYCIN 250 MG PO TABS: ORAL_TABLET | ORAL | Status: DC

## 2015-09-04 NOTE — Progress Notes (Signed)
Post Operative Visit  Procedure:D&C/Hysteroscopy Days Post-op: 2 weeks  Subjective: Pt reports she is doing well.  Pathology reviewed.  Pt has some spotting for about three or four days.  Never needed pain medication. Pt reports her RLQ pain is much better.  Reports she has experienced some increased cough and yellowish sputum production since surgery.  Just can't seem to shake it.  No fevers.  No SOB.  Minimal cough.    Objective: LMP 01/02/2015  EXAM General: alert and cooperative Resp: clear to auscultation bilaterally Cardio: regular rate and rhythm, S1, S2 normal, no murmur, click, rub or gallop GI: soft, non-tender; bowel sounds normal; no masses,  no organomegaly Extremities: extremities normal, atraumatic, no cyanosis or edema Vaginal Bleeding: none  Gyn:  NAEFG, vaginal pink and moist, no lesions, cervix closed.  No CMT  Assessment: s/p hysteroscopy with endocervical polyp resection, D&C URI   Plan: Pt has AEX scheduled April, 2017. Z pak to pharmacy.  Pt will call back with any worsening symptoms or if symptoms do not resolve. Pt advised to not be worried if has any spotting over next 4-6 weeks but after that time, she is advised to call back.

## 2016-01-25 ENCOUNTER — Other Ambulatory Visit: Payer: Self-pay | Admitting: Certified Nurse Midwife

## 2016-01-25 DIAGNOSIS — Z1231 Encounter for screening mammogram for malignant neoplasm of breast: Secondary | ICD-10-CM

## 2016-02-03 ENCOUNTER — Ambulatory Visit: Payer: Managed Care, Other (non HMO)

## 2016-02-04 ENCOUNTER — Ambulatory Visit (HOSPITAL_BASED_OUTPATIENT_CLINIC_OR_DEPARTMENT_OTHER)
Admission: RE | Admit: 2016-02-04 | Discharge: 2016-02-04 | Disposition: A | Discharge status: Home / Self Care | Payer: Managed Care, Other (non HMO) | Source: Ambulatory Visit | Attending: Certified Nurse Midwife | Admitting: Certified Nurse Midwife

## 2016-02-04 DIAGNOSIS — Z1231 Encounter for screening mammogram for malignant neoplasm of breast: Secondary | ICD-10-CM | POA: Diagnosis not present

## 2016-03-07 ENCOUNTER — Ambulatory Visit: Payer: Managed Care, Other (non HMO) | Admitting: Certified Nurse Midwife

## 2016-03-09 ENCOUNTER — Encounter: Payer: Self-pay | Admitting: Certified Nurse Midwife

## 2016-03-09 ENCOUNTER — Ambulatory Visit (INDEPENDENT_AMBULATORY_CARE_PROVIDER_SITE_OTHER): Payer: Managed Care, Other (non HMO) | Admitting: Certified Nurse Midwife

## 2016-03-09 VITALS — BP 92/60 | HR 68 | Resp 16 | Ht 61.25 in | Wt 141.0 lb

## 2016-03-09 DIAGNOSIS — R195 Other fecal abnormalities: Secondary | ICD-10-CM

## 2016-03-09 DIAGNOSIS — Z01419 Encounter for gynecological examination (general) (routine) without abnormal findings: Secondary | ICD-10-CM

## 2016-03-09 DIAGNOSIS — N951 Menopausal and female climacteric states: Secondary | ICD-10-CM

## 2016-03-09 DIAGNOSIS — Z0000 Encounter for general adult medical examination without abnormal findings: Secondary | ICD-10-CM

## 2016-03-09 DIAGNOSIS — N912 Amenorrhea, unspecified: Secondary | ICD-10-CM

## 2016-03-09 LAB — COMPREHENSIVE METABOLIC PANEL
ALBUMIN: 4.1 g/dL (ref 3.6–5.1)
ALT: 10 U/L (ref 6–29)
AST: 14 U/L (ref 10–35)
Alkaline Phosphatase: 71 U/L (ref 33–115)
BILIRUBIN TOTAL: 0.8 mg/dL (ref 0.2–1.2)
BUN: 12 mg/dL (ref 7–25)
CHLORIDE: 105 mmol/L (ref 98–110)
CO2: 27 mmol/L (ref 20–31)
CREATININE: 0.78 mg/dL (ref 0.50–1.10)
Calcium: 9.7 mg/dL (ref 8.6–10.2)
Glucose, Bld: 81 mg/dL (ref 65–99)
Potassium: 4.9 mmol/L (ref 3.5–5.3)
SODIUM: 142 mmol/L (ref 135–146)
TOTAL PROTEIN: 6.5 g/dL (ref 6.1–8.1)

## 2016-03-09 LAB — LIPID PANEL
CHOLESTEROL: 207 mg/dL — AB (ref 125–200)
HDL: 94 mg/dL (ref >=46)
LDL CALC: 102 mg/dL (ref <130)
TRIGLYCERIDES: 55 mg/dL (ref <150)
Total CHOL/HDL Ratio: 2.2 Ratio (ref <=5.0)
VLDL: 11 mg/dL (ref <30)

## 2016-03-09 LAB — POCT URINALYSIS DIPSTICK
Bilirubin, UA: NEGATIVE
Blood, UA: NEGATIVE
Glucose, UA: NEGATIVE
Ketones, UA: NEGATIVE
LEUKOCYTES UA: NEGATIVE
Nitrite, UA: NEGATIVE
PH UA: 5
PROTEIN UA: NEGATIVE
UROBILINOGEN UA: NEGATIVE

## 2016-03-09 LAB — POCT URINE PREGNANCY: Preg Test, Ur: NEGATIVE

## 2016-03-09 LAB — HEMOGLOBIN, FINGERSTICK: HEMOGLOBIN, FINGERSTICK: 14.5 g/dL (ref 12.0–16.0)

## 2016-03-09 LAB — TSH: TSH: 1.06 mIU/L

## 2016-03-09 LAB — HIV ANTIBODY (ROUTINE TESTING W REFLEX): HIV 1&2 Ab, 4th Generation: NONREACTIVE

## 2016-03-09 LAB — HEPATITIS C ANTIBODY: HCV AB: NEGATIVE

## 2016-03-09 NOTE — Progress Notes (Signed)
50 y.o. G0P0000 Married  Caucasian Fe here for annual exam. Period returned in 12/16 and no period since. Monitoring period occurrence. Continues with hot flashes and some night sweats no insomnia. Patient feels like she has recovered well from surgery for D&C and polyp removal. Taking Chaste tree berry with some change. Not playing tennis competitively this spring, but exercising daily. Has tight muscle in right arm and neck today. Will use ice on later, no injury. Desires screening labs today. Has noted bowel movement change with color and amount over the past month. Denies any black or tarry stools, mainly brown or grey, no diet change or supplement change. Patient due for colonoscopy this year and will call GI and schedule. See PCP prn. No other health issues today.   Patient's last menstrual period was 11/15/2015.          Sexually active: Yes.    The current method of family planning is menopausal.    Exercising: Yes.    tennis,walking,stretching & yoga Smoker:  no  Health Maintenance: Pap:  03-06-15 neg HPV HR neg MMG: 02-04-16 category b density,birads 1:neg Colonoscopy:  2012 repeat 5 years BMD:   none TDaP:  2011 Shingles: no Pneumonia: no Hep C and HIV: none Labs: poct urine-neg, hgb-14.5 Self breast exam: done occ   reports that she has never smoked. She has never used smokeless tobacco. She reports that she drinks about 0.6 - 1.2 oz of alcohol per week. She reports that she does not use illicit drugs.  Past Medical History  Diagnosis Date  . Hives 2013    Chronic   . Family history of adverse reaction to anesthesia     paternal great uncle took 3 days to wake up, paternal cousin CPR during surgery  . Dysrhythmia     wore Holter monitor and per pt dr said he was ok with results  . Anxiety   . GERD (gastroesophageal reflux disease)     Past Surgical History  Procedure Laterality Date  . Laparoscopy  1992    due to endometriosis - Lupron x 6 months  . Colonoscopy with  propofol    . Dilatation & currettage/hysteroscopy with resectocope N/A 08/17/2015    Procedure: DILATATION & CURETTAGE/HYSTEROSCOPY;  Surgeon: Megan Salon, MD;  Location: [facility];  Service: Gynecology;  Laterality: N/A;    Current Outpatient Prescriptions  Medication Sig Dispense Refill  . acetaminophen (TYLENOL) 325 MG tablet Take 325 mg by mouth daily as needed for mild pain.    . Chaste Tree (VITEX EXTRACT PO) Take 1 tablet by mouth daily.     . Cholecalciferol (VITAMIN D-3) 1000 UNITS CAPS Take 1 capsule by mouth daily.    Marland Kitchen MILK THISTLE PO Take 1 tablet by mouth daily.    . Multiple Vitamins-Minerals (MULTIVITAMIN PO) Take 1 tablet by mouth daily.     . Omega-3 Fatty Acids (FISH OIL PO) Take 1 capsule by mouth daily.     Marland Kitchen OVER THE COUNTER MEDICATION Take 1 capsule by mouth daily as needed (for upset stomach/heartburn (Herbal Deglycyrrhizinated Licorice)).     No current facility-administered medications for this visit.    Family History  Problem Relation Age of Onset  . Hypertension Paternal Grandfather   . Heart attack Paternal Grandfather   . Depression Father   . Anxiety disorder Sister     ROS:  Pertinent items are noted in HPI.  Otherwise, a comprehensive ROS was negative.  Exam:   BP 92/60 mmHg  Pulse 68  Resp 16  Ht 5' 1.25" (1.556 m)  Wt 141 lb (63.957 kg)  BMI 26.42 kg/m2  LMP 11/15/2015 Height: 5' 1.25" (155.6 cm) Ht Readings from Last 3 Encounters:  03/09/16 5' 1.25" (1.556 m)  08/17/15 5\' 1"  (1.549 m)  03/05/15 5' 1.25" (1.556 m)    General appearance: alert, cooperative and appears stated age Head: Normocephalic, without obvious abnormality, atraumatic Neck: no adenopathy, supple, symmetrical, trachea midline and thyroid normal to inspection and palpation Lungs: clear to auscultation bilaterally Breasts: normal appearance, no masses or tenderness, No nipple retraction or dimpling, No nipple discharge or bleeding, No axillary or supraclavicular  adenopathy Heart: regular rate and rhythm Abdomen: soft, non-tender; no masses,  no organomegaly Extremities: extremities normal, atraumatic, no cyanosis or edema Skin: Skin color, texture, turgor normal. No rashes or lesions Lymph nodes: Cervical, supraclavicular, and axillary nodes normal. No abnormal inguinal nodes palpated Neurologic: Grossly normal   Pelvic: External genitalia:  no lesions              Urethra:  normal appearing urethra with no masses, tenderness or lesions              Bartholin's and Skene's: normal                 Vagina: normal appearing vagina with normal color and discharge, no lesions              Cervix: no cervical motion tenderness, no lesions and nulliparous appearance              Pap taken: No. Bimanual Exam:  Uterus:  normal size, contour, position, consistency, mobility, non-tender              Adnexa: normal adnexa and no mass, fullness, tenderness               Rectovaginal: Confirms               Anus:  normal sphincter tone, no lesions  Chaperone present: yes  A:  Well Woman with normal exam  Contraception none  Perimenopausal with one period since surgery  Stool change  Screening labs  Right shoulder pain ? Due to activity  P:   Reviewed health and wellness pertinent to exam  Stressed importance of monitoring period occurrence and advise if heavy bleeding or no bleeding and will advise if menopausal symptoms are not being tolerated well. Handout given. Questions addressed at length.  Warning signs of stool change given. Stressed importance of following up with GI for evaluation and concerns with bleeding in stool. Questions addressed. Offered to schedule, patient already has reminder card and will call.  Labs:CMP, Hep C,HIV,Lipid panel, TSH, Vit.D  Discussed using ice and OTC Motrin for pain, and stretching to see if resolves. If no change or pain increases needs to see orthopedic or Urgent care. Patient agreeable.  Pap smear as above  Not  taken   counseled on breast self exam, mammography screening, menopause, adequate intake of calcium and vitamin D, diet and exercise  return annually or prn  15 minutes in face to face discussion regarding menopausal changes and expectations and possible management of symptoms.  An After Visit Summary was printed and given to the patient.

## 2016-03-09 NOTE — Patient Instructions (Signed)

## 2016-03-10 LAB — VITAMIN D 25 HYDROXY (VIT D DEFICIENCY, FRACTURES): Vit D, 25-Hydroxy: 40 ng/mL (ref 30–100)

## 2016-03-15 NOTE — Progress Notes (Signed)
Encounter reviewed Mihran Lebarron, MD   

## 2016-09-01 ENCOUNTER — Encounter: Payer: Self-pay | Admitting: Sports Medicine

## 2016-09-01 ENCOUNTER — Ambulatory Visit (INDEPENDENT_AMBULATORY_CARE_PROVIDER_SITE_OTHER): Payer: Managed Care, Other (non HMO) | Admitting: Sports Medicine

## 2016-09-01 VITALS — BP 107/47 | Ht 61.0 in | Wt 140.0 lb

## 2016-09-01 DIAGNOSIS — S76911A Strain of unspecified muscles, fascia and tendons at thigh level, right thigh, initial encounter: Secondary | ICD-10-CM | POA: Diagnosis not present

## 2016-09-01 DIAGNOSIS — S76211A Strain of adductor muscle, fascia and tendon of right thigh, initial encounter: Secondary | ICD-10-CM

## 2016-09-01 NOTE — Progress Notes (Signed)
  Laurie Parsons - 50 y.o. female MRN [id number]  Date of birth: [birth year]  SUBJECTIVE:  Including CC & ROS.  CC: right groin pain  Laurie Parsons- Judene Companion is a 50 yo female presenting with 1 year of worsening right groin pain. She describes it as a constant pain, extending from her medial knee along the medial aspect of her leg up to her groin and around to her lateral hip. It is worsened by driving (when she has to lift her leg), sitting for extended periods of time, stepping up. She used to walk 2-3 times a week and play tennis 1-2 times a week but these activity irritate her groin so she is limiting them.   Inciting events: she remembers stretching her hip flexors a year ago and hearing a pop. She also had a very difficult tennis lesson that resulted in severe pain and soreness along her inner thighs. She rested for 3 weeks because she felt like she pulled something.  Recent injuries: 2.5 years ago, she tore her left calf and was in a boot for several months. Then one year ago she tore her ligament in her R big toe. She feels like her gait has been off since this. She also reports chronic low back back on her right side and right shoulder pain as well.    Since the pain was also in her lower abdomen, she has a gynecologic evaluation. U/S showed cervical polyp, which they removed in hope of relieving symptoms but it did not. She recently had a colonoscopy which was normal.   ROS: No unexpected weight loss, fever, chills, swelling, instability, numbness/tingling, redness, otherwise see HPI   PMHx - Updated and reviewed.  Contributory factors include: see recent problems above. Hx of SVT.  PSHx - Updated and reviewed.  Contributory factors include:  Negative FHx - Updated and reviewed.  Contributory factors include:  Negative Social Hx - Updated and reviewed. Contributory factors include: Negative Medications - reviewed    PHYSICAL EXAM:  VS: BP:(!) 107/47  HR: bpm  TEMP: ( )   RESP:   HT:5\' 1"  (154.9 cm)   WT:140 lb (63.5 kg)  BMI:26.5 PHYSICAL EXAM: Gen: NAD, alert, cooperative with exam, well-appearing HEENT: clear conjunctiva,  CV:  no edema, capillary refill brisk, normal rate Resp: non-labored Skin: no rashes, normal turgor  Neuro: no gross deficits.  Psych:  alert and oriented  MSK: No erythema, bony deformities of hip Palpation: pain with palpation along groin. Mild pain with palpation over right SI joint. Mild pain over pubic symphysis.  ROM: Full passive and active ROM in hip joint, pain with active flexion Strength: Equal strength with hip flexion, extension, abduction. Adduction is 2/5 on right side compared to 5/5 on left side. Right sartorius muscle weakness (weakness with external rotation, flexion of leg)  No sports hernia on exam.  Normal gait. No leg length discrepancy.  Leg neurovascularly intact with equal sensation, good distal pulses    ASSESSMENT & PLAN:  Patient's location of pain and weakness of gracilis and sartorius muscle are consistent with strain of these muscles, likely from tennis injury.  Muscle strain: - Patient given exercises to strengthen abdominal muscles, gracilis muscle, sartorius muscle - Given thigh sleeve to wear while active - follow up in 6-8 weeks

## 2016-09-01 NOTE — Patient Instructions (Signed)
Easy crunches- sets of 15 1. Straight up 2. Crunch with easy rotation  3 Sets of 15 1. Gracilis muscle: leg lifts lying on side, lifting lower leg up 2. Sartorius:sitting up,  left leg hacky sack  3. Hip flexions standing up 4. Standing hip rotation  Once you get strong, add two pound weight  Use thigh sleeve while you are walking and playing tennis  Return in 6-8 weeks

## 2016-12-07 ENCOUNTER — Ambulatory Visit: Payer: Self-pay

## 2016-12-07 ENCOUNTER — Ambulatory Visit (INDEPENDENT_AMBULATORY_CARE_PROVIDER_SITE_OTHER): Payer: Managed Care, Other (non HMO) | Admitting: Sports Medicine

## 2016-12-07 ENCOUNTER — Encounter: Payer: Self-pay | Admitting: Sports Medicine

## 2016-12-07 VITALS — BP 98/72 | HR 70 | Ht 61.0 in | Wt 140.0 lb

## 2016-12-07 DIAGNOSIS — M25551 Pain in right hip: Secondary | ICD-10-CM | POA: Diagnosis not present

## 2016-12-07 DIAGNOSIS — S76211A Strain of adductor muscle, fascia and tendon of right thigh, initial encounter: Secondary | ICD-10-CM | POA: Diagnosis not present

## 2016-12-07 DIAGNOSIS — R1031 Right lower quadrant pain: Secondary | ICD-10-CM

## 2016-12-07 NOTE — Assessment & Plan Note (Signed)
[place] evaluation reveals some mild changes but no major tears  Plan to send her for formal PT  She will hold tennis for 3 wks  I think if she can improve strength of sartorius, Glut. Medius and adductors she will have less pain  Reck 2 mos

## 2016-12-07 NOTE — Progress Notes (Signed)
CC: F/U of RT groin pain  On last visit patient had makred weakness of gracilis and sartorius 1 year of groin pain Hx of possible tennis injury  Since last visit has improved some but still has some groin pain most days If she plays tennis - OK while on court - that night will be very painful  Had to stop some of  home exercises as they were too painful  Comes for recheck  ROS No sciatica No numbness No pain with functional movement classes  PE Pleasant F in NAD BP 98/72   Pulse 70   Ht 5\' 1"  (1.549 m)   Wt 140 lb (63.5 kg)   BMI 26.45 kg/m   Full ROM RT hip Norm hip flexion strength Weak hip abduction Weak sartorius test These are now at moderate weakness level Hip adductor has improved from 2/5 to 4/5 strength and no pain  Ultrasound of RT Hip  Hip joint appears normal with no effusion Sartorius shows shows hypoechoic change proximal to ASIS Some hyperechoic change near insertion of Rectus femoris at AIIS Gracilis looks normal Other muscle areas look normal  Ultrasound compatible with Sartorius and upper thigh strain but no true tear noted.  Ultrasound and interpretation by Wolfgang Phoenix. Oneida Alar, MD

## 2016-12-09 ENCOUNTER — Ambulatory Visit: Payer: Managed Care, Other (non HMO) | Attending: Sports Medicine | Admitting: Physical Therapy

## 2016-12-09 DIAGNOSIS — M25551 Pain in right hip: Secondary | ICD-10-CM | POA: Insufficient documentation

## 2016-12-09 DIAGNOSIS — M6281 Muscle weakness (generalized): Secondary | ICD-10-CM | POA: Diagnosis present

## 2016-12-09 NOTE — Therapy (Signed)
[place], [place], [address] Phone: [phone]   Fax:  [phone]  Physical Therapy Evaluation  Patient Details  Name: Laurie Parsons MRN: [id number] Date of Birth: 03/23/1966 Referring Provider: Dr. Stefanie Libel  Encounter Date: 12/09/2016      PT End of Session - 12/09/16 1119    Visit Number 1   Number of Visits 8   Date for PT Re-Evaluation 02/03/17   PT Start Time 1019   PT Stop Time 1105   PT Time Calculation (min) 46 min   Activity Tolerance Patient tolerated treatment well   Behavior During Therapy [facility] for tasks assessed/performed      Past Medical History:  Diagnosis Date  . Anxiety   . Dysrhythmia    wore Holter monitor and per pt dr said he was ok with results  . Family history of adverse reaction to anesthesia    paternal great uncle took 3 days to wake up, paternal cousin CPR during surgery  . GERD (gastroesophageal reflux disease)   . Hives 2013   Chronic     Past Surgical History:  Procedure Laterality Date  . COLONOSCOPY WITH PROPOFOL    . DILATATION & CURRETTAGE/HYSTEROSCOPY WITH RESECTOCOPE N/A 08/17/2015   Procedure: DILATATION & CURETTAGE/HYSTEROSCOPY;  Surgeon: Megan Salon, MD;  Location: [facility];  Service: Gynecology;  Laterality: N/A;  . LAPAROSCOPY  1992   due to endometriosis - Lupron x 6 months    There were no vitals filed for this visit.       Subjective Assessment - 12/09/16 1022    Subjective Patient with chronic issues in Rt. has been ongoing for 2 years  She attributes the initial injury to when she injured her L calf, then Rt. toe while playing tennis. She has pain in her Rt. medial knee extending up to prox lateral and anterior thigh, posteriorly to hip and at times into her back.  She has begun doing exercises online (Pilates, Tai chi) and has noticed improved strength.  She has stopped playing tennis for now.     Pertinent History 3 yr history  of orthopedic issues, mild low back pain, rt. shoulder pain.    Limitations Standing;Walking;Other (comment);Sitting  tennis    How long can you sit comfortably? 1 hour (up to)    How long can you stand comfortably? 2-3 hours with low level discomfort    How long can you walk comfortably? not limited    Diagnostic tests US revealed changes in sartorius m and ant prox to Rt. ASIS , diagnosed as upper thigh and sartiorius strain   Patient Stated Goals Patient would like be active and sleep better    Currently in Pain? Yes   Pain Score 3    Pain Location Hip   Pain Orientation Right;Proximal;Upper;Lateral   Pain Descriptors / Indicators Aching;Discomfort;Other (Comment)  has rolling, snapping    Pain Type Chronic pain   Pain Onset More than a month ago   Pain Frequency Constant   Aggravating Factors  sitting long periods , driving, after walking and tennis will incr pain that night to severe    Pain Relieving Factors heat, stretching, movement    Effect of Pain on Daily Activities limits activitiy    Multiple Pain Sites --  shoulder, neck             [facility] PT Assessment - 12/09/16 1030      Assessment   Medical Diagnosis L hip pain  Referring Provider Dr. 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   Leisure being active     Cognition   Overall Cognitive Status Within Functional Limits for tasks assessed     Observation/Other Assessments   Focus on Therapeutic Outcomes (FOTO)  11%     Sensation   Light Touch Appears Intact      Coordination   Gross Motor Movements are Fluid and Coordinated Not tested     Posture/Postural Control   Posture Comments WFL      AROM   Right Hip Flexion 130  85 deg SLR    Left Hip Flexion 130  90 deg SLR     Strength   Right Hip ABduction 3+/5   Right Hip ADduction 4-/5   Left Hip ABduction 5/5   Left Hip ADduction 4/5   Right/Left Knee --  WNL      Flexibility   Soft Tissue Assessment /Muscle Length yes   Hamstrings 90/90 Rt. 8 deg , L. 0 deg    Quadriceps WNL, pain in lumbar with prone knee bend    ITB tight on L    Piriformis min soreness, pain end Range ER  and IR      Palpation   Palpation comment pain in Rt post lateral hip, TTP GR Troch , non tender ASIS and prox, distal      Saralyn Pilar (FABER) Test   Findings Positive   Side Right   Comments pos FADER, pain in groin      Thomas Test    Findings Negative   Side Right   Comments bilat but ITB on L pulls LE laterally     Hip Scouring   Findings Positive   Side Right                           PT Education - 12/09/16 1032    Education provided Yes   Education Details PT/POC, HEP, muscle balance/imbalance, dry needling    Person(s) Educated Patient   Methods Explanation;Demonstration   Comprehension Returned demonstration;Verbalized understanding          PT Short Term Goals - 12/09/16 1156      PT SHORT TERM GOAL #1   Title Pt will be I with HEP    Time 2   Period Weeks   Status New     PT SHORT TERM GOAL #2   Title Pt will be able to lift leg to don shoe with less difficulty.    Time 4   Period Weeks   Status New           PT Long Term Goals - 12/09/16 1158      PT LONG TERM GOAL #1   Title Pt will be able to demo 5/5 strength throughout hip.     Time 8   Period Weeks  Status New     PT LONG TERM GOAL #2   Title Pt will be I with more advanced HEP for core and flexibility    Time 8   Period Weeks   Status New     PT LONG TERM GOAL #3   Title Pt will be  able to play tennis with min pain increase from baseline.    Time 8   Period Weeks   Status New               Plan - 12/09/16 1119    Clinical Impression Statement Patient with low complexity eval of chronic Rt. hip pain, involving pelvis and possibly lumbar spine.  ITB tighter on L, all PROM and AROM on Rt caused pain in lateral hip and even in lower lumbar spine.  Mild weakness in Rt hip abd, add.  She has pain with hip flexion, suspect muscle strain with compensatory pelvic strategies.  She will do well with corretctive exercises, focusing on stretching to Sartorius (IR and ext) then strengthening.     Rehab Potential Excellent   PT Frequency 1x / week   PT Duration 8 weeks   PT Treatment/Interventions ADLs/Self Care Home Management;Moist Heat;Therapeutic activities;Dry needling;Therapeutic exercise;Ultrasound;Neuromuscular re-education;Manual techniques;Taping;Cryotherapy;Electrical Stimulation;Iontophoresis 38m/ml Dexamethasone;Functional mobility training;Patient/family education   PT Next Visit Plan check HEP, hip mobs, add core, low abd , ionto?   PT Home Exercise Plan SLR sidelying, IR, ITB   Consulted and Agree with Plan of Care Patient      Patient will benefit from skilled therapeutic intervention in order to improve the following deficits and impairments:  Decreased strength, Impaired flexibility, Pain  Visit Diagnosis: Pain in right hip  Muscle weakness (generalized)     Problem List Patient Active Problem List   Diagnosis Date Noted  . Strain of adductor muscle, fascia and tendon of right thigh, initial encounter 09/01/2016  . History of endometriosis 07/19/2015  . Hydrosalpinx 07/19/2015  . Intramural leiomyoma of uterus 07/19/2015  . Cervical polyp 07/19/2015  . FOM (frequency of micturition) 05/08/2013  . LACERATION OF FINGER 10/09/2010  . Paroxysmal [place]ventricular tachycardia ([place] 06/04/2009    Jheri Mitter 12/09/2016, 5:11 PM  [facility] [address]Phone: [phone]  Fax:  [phone] Name: LDwan FennelMRN: [id number] of Birth: 402/26/1967 JRaeford Razor PT 12/09/16 5:12 PM Phone: [phone]: [phone]

## 2016-12-16 ENCOUNTER — Ambulatory Visit: Payer: Managed Care, Other (non HMO) | Admitting: Physical Therapy

## 2016-12-23 ENCOUNTER — Ambulatory Visit: Payer: Managed Care, Other (non HMO) | Admitting: Physical Therapy

## 2016-12-23 DIAGNOSIS — M25551 Pain in right hip: Secondary | ICD-10-CM

## 2016-12-23 DIAGNOSIS — M6281 Muscle weakness (generalized): Secondary | ICD-10-CM

## 2016-12-23 NOTE — Therapy (Addendum)
[place], [place], [address] Phone: [phone]   Fax:  [phone]  Physical Therapy Treatment, Discharge  Patient Details  Name: Laurie Parsons MRN: [id number] Date of Birth: May 09, 1966 Referring Provider: Dr. Stefanie Libel  Encounter Date: 12/23/2016      PT End of Session - 12/23/16 1122    Visit Number 2   Number of Visits 8   Date for PT Re-Evaluation 02/03/17   PT Start Time 7322   PT Stop Time 1110   PT Time Calculation (min) 55 min   Activity Tolerance Patient tolerated treatment well   Behavior During Therapy [facility] for tasks assessed/performed      Past Medical History:  Diagnosis Date  . Anxiety   . Dysrhythmia    wore Holter monitor and per pt dr said he was ok with results  . Family history of adverse reaction to anesthesia    paternal great uncle took 3 days to wake up, paternal cousin CPR during surgery  . GERD (gastroesophageal reflux disease)   . Hives 2013   Chronic     Past Surgical History:  Procedure Laterality Date  . COLONOSCOPY WITH PROPOFOL    . DILATATION & CURRETTAGE/HYSTEROSCOPY WITH RESECTOCOPE N/A 08/17/2015   Procedure: DILATATION & CURETTAGE/HYSTEROSCOPY;  Surgeon: Megan Salon, MD;  Location: [facility];  Service: Gynecology;  Laterality: N/A;  . LAPAROSCOPY  1992   due to endometriosis - Lupron x 6 months    There were no vitals filed for this visit.      Subjective Assessment - 12/23/16 1019    Subjective Overdid her hip abduction ex.  Flared up Wed night. Adduction ex are good and my hip feels so good when I stretch    Currently in Pain? Yes   Pain Score 4    Pain Location Hip   Pain Orientation Right;Anterior;Proximal;Lateral   Pain Descriptors / Indicators Aching   Pain Type Chronic pain   Pain Radiating Towards medial knee    Pain Onset More than a month ago   Pain Frequency Constant   Aggravating Factors  sitting, driving   Pain Relieving  Factors heat, stretching            [facility] PT Assessment - 12/23/16 1118      Palpation   Palpation comment Rt. ASIS higher, L leg shorter stays short with long sit            [facility] Adult PT Treatment/Exercise - 12/23/16 1036      Knee/Hip Exercises: Stretches   Hip Flexor Stretch Both;2 reps   ITB Stretch Both;2 reps   Other Knee/Hip Stretches sartorius IR on mat with wide knees      Knee/Hip Exercises: Supine   Hip Adduction Isometric Strengthening;Both;1 set;10 reps   Bridges Strengthening;Both;1 set;10 reps   Bridges Limitations with ball , articulating    Other Supine Knee/Hip Exercises cues for core    Other Supine Knee/Hip Exercises clam with blue band bilat.  x 15 and then unilat., x 10      Knee/Hip Exercises: Sidelying   Hip ABduction Strengthening;Both;1 set   Hip ABduction Limitations cues for decr ROM and not using trunk    Hip ADduction Strengthening;Both;1 set   Other Sidelying Knee/Hip Exercises QL stretch R     Moist Heat Therapy   Number Minutes Moist Heat 10 Minutes   Moist Heat Location Lumbar Spine;Hip     Manual Therapy   Manual Therapy Manual Traction;Muscle  Energy Technique   Joint Mobilization Rt. hip ant capsule stretch    Soft tissue mobilization Rt. QL trigger/tender points    Myofascial Release Rt. trunk and prox. hip   Manual Traction Rt. leg in supine    Muscle Energy Technique resisted hip flexion x 5                PT Education - 12/23/16 1121    Education provided Yes   Education Details MET, manual and HEP, core, QL mm, anatomy   Person(s) Educated Patient   Methods Explanation;Demonstration   Comprehension Verbalized understanding;Returned demonstration          PT Short Term Goals - 12/23/16 1127      PT SHORT TERM GOAL #1   Title Pt will be I with HEP    Status On-going     PT SHORT TERM GOAL #2   Title Pt will be able to lift leg to don shoe with less difficulty.    Status On-going           PT  Long Term Goals - 12/23/16 1127      PT LONG TERM GOAL #1   Title Pt will be able to demo 5/5 strength throughout hip.     Status On-going     PT LONG TERM GOAL #2   Title Pt will be I with more advanced HEP for core and flexibility    Status On-going     PT LONG TERM GOAL #3   Title Pt will be able to play tennis with min pain increase from baseline.    Baseline has not played   Status On-going               Plan - 12/23/16 1124    Clinical Impression Statement Pt doing HEP with accuracy.  Flare up may be due to use of global mm (QL) to lift Rt leg into abduction.  Pain pattern is low lumbar, wrapping across hip and groin, to medial knee. QL muscle found to be very tight and painful. No pain after session.     PT Next Visit Plan stretch hips, low abd progression, Pilates, ionto?    PT Home Exercise Plan SLR sidelying, IR, ITB, bridge and clam blue band, QL stretch    Consulted and Agree with Plan of Care Patient      Patient will benefit from skilled therapeutic intervention in order to improve the following deficits and impairments:  Decreased strength, Impaired flexibility, Pain  Visit Diagnosis: Pain in right hip  Muscle weakness (generalized)     Problem List Patient Active Problem List   Diagnosis Date Noted  . Strain of adductor muscle, fascia and tendon of right thigh, initial encounter 09/01/2016  . History of endometriosis 07/19/2015  . Hydrosalpinx 07/19/2015  . Intramural leiomyoma of uterus 07/19/2015  . Cervical polyp 07/19/2015  . FOM (frequency of micturition) 05/08/2013  . LACERATION OF FINGER 10/09/2010  . Paroxysmal supraventricular tachycardia ([place]) 06/04/2009    PAA,JENNIFER 12/23/2016, 11:28 AM  [facility] [address] [place], [place], [address] Phone: [phone]   Fax:  [phone]  Name: Nikhita Mentzel MRN: [id number] Date of Birth: 04/15/1966  Raeford Razor,  PT 12/23/16 11:28 AM Phone: [phone] Fax: [phone]   PHYSICAL THERAPY DISCHARGE SUMMARY  Visits from Start of Care: 2  Current functional level related to goals / functional outcomes: Unknown See above for most recent info   Remaining deficits: Unknown  Education / Equipment: HEP, stretching, posture and body mechanics  Plan: Patient agrees to discharge.  Patient goals were not met. Patient is being discharged due to not returning since the last visit.  ?????    Raeford Razor, PT 02/13/17 2:02 PM Phone: [phone] Fax: [phone]

## 2016-12-23 NOTE — Patient Instructions (Addendum)
Bracing With Bridging (Hook-Lying)    With neutral spine, tighten pelvic floor and abdominals and hold. Lift bottom. Repeat __10_ times. Do __1-2_ times a day. ROLL UP YOUR SPINE ONE BONE AT A TIME.  Pause at the top, as you exhale, roll back down starting with middle back and back down to neutral.  Can squeeze a ball or pillow.    Copyright  VHI. All rights reserved.    Clam with blue band: engage abdominals and with blue band wrapped around thighs, press out into the band x 10-20.  Try doing one leg at a time keeping the opposite leg still.     QL stretch see handout

## 2016-12-30 ENCOUNTER — Ambulatory Visit: Payer: Managed Care, Other (non HMO) | Admitting: Physical Therapy

## 2017-02-22 ENCOUNTER — Other Ambulatory Visit: Payer: Self-pay | Admitting: Certified Nurse Midwife

## 2017-02-22 DIAGNOSIS — Z1231 Encounter for screening mammogram for malignant neoplasm of breast: Secondary | ICD-10-CM

## 2017-02-23 ENCOUNTER — Ambulatory Visit: Payer: Self-pay

## 2017-02-23 ENCOUNTER — Ambulatory Visit (INDEPENDENT_AMBULATORY_CARE_PROVIDER_SITE_OTHER): Payer: Managed Care, Other (non HMO) | Admitting: Sports Medicine

## 2017-02-23 ENCOUNTER — Encounter: Payer: Self-pay | Admitting: Sports Medicine

## 2017-02-23 VITALS — BP 103/56 | Ht 61.0 in | Wt 145.0 lb

## 2017-02-23 DIAGNOSIS — M25551 Pain in right hip: Secondary | ICD-10-CM | POA: Diagnosis not present

## 2017-02-23 NOTE — Progress Notes (Signed)
  Laurie Parsons - 51 y.o. female MRN [id number]  Date of birth: [birth year]  SUBJECTIVE:  Including CC & ROS.  CC: right hip pain  Presents in follow up for right hip pain.  She was having groin and lateral leg pain on her right at her last visit.  She has been in PT after she was found to have hip adduction and sartorius weakness.  This has improved and she is no longer having lateral leg pain.  She complains of groin pain that is ongoing.  She denies any decreased ROM.  She reports that her pain and strength has improved and this is only a nagging pain.  Denies much back pain, no numbness, tingling, sciatic pain.  Has this intermittently, but mostly after exertion. Has not been taking NSAIDs.    ROS: No unexpected weight loss, fever, chills, swelling, instability, muscle pain, numbness/tingling, redness, otherwise see HPI   PMHx - Updated and reviewed.  Contributory factors include: PSVT, fibroids PSHx - Updated and reviewed.  Contributory factors include:  Negative FHx - Updated and reviewed.  Contributory factors include:  Negative Social Hx - Updated and reviewed. Contributory factors include: Negative Medications - reviewed   DATA REVIEWED: Previous office notes  PHYSICAL EXAM:  VS: BP:(!) 103/56  HR: bpm  TEMP: ( )  RESP:   HT:5\' 1"  (154.9 cm)   WT:145 lb (65.8 kg)  BMI:27.5 PHYSICAL EXAM: Gen: NAD, alert, cooperative with exam, well-appearing HEENT: clear conjunctiva,  CV:  no edema, capillary refill brisk, normal rate Resp: non-labored Skin: no rashes, normal turgor  Neuro: no gross deficits.  Psych:  alert and oriented  Hip: ROM IR: 45 Deg, ER: 45 Deg, Flexion: 90 Deg, Extension: 70 Deg, Abduction: 45 Deg, Adduction: 45 Deg Strength IR: 5/5, ER: 5/5, Flexion: 5/5, Extension: 5/5, Abduction: 5/5, Adduction: 4/5 right, sartorius 4/5 right Pelvic alignment with anterior innominate on right. Standing hip rotation and gait without trendelenburg sign /  unsteadiness. Greater trochanter without tenderness to palpation. No tenderness over piriformis. No pain with FADIR, mild lateral pain with FABER on right. +SI joint tenderness and slightly decreased SI movement on right.  Ultrasound: limited ultrasound of right hip shows no effusion or arthritis at hip joint, peripheral labrum without degeneration.  Iliopsoas without tearing and has good fibrillary pattern.  ASSESSMENT & PLAN:   Right hip pain Continue home exercises, focus more on low back to help with SI joint and pelvic alignment.  Continue adductor and sartorius strengthening.  Follow up 6 weeks.  Do not believe this is intraarticular in nature due to ROM and no pain with hip joint movement.

## 2017-02-26 NOTE — Assessment & Plan Note (Signed)
Continue home exercises, focus more on low back to help with SI joint and pelvic alignment.  Continue adductor and sartorius strengthening.  Follow up 6 weeks.  Do not believe this is intraarticular in nature due to ROM and no pain with hip joint movement.

## 2017-03-02 ENCOUNTER — Ambulatory Visit (HOSPITAL_BASED_OUTPATIENT_CLINIC_OR_DEPARTMENT_OTHER)
Admission: RE | Admit: 2017-03-02 | Discharge: 2017-03-02 | Disposition: A | Discharge status: Home / Self Care | Payer: Managed Care, Other (non HMO) | Source: Ambulatory Visit | Attending: Certified Nurse Midwife | Admitting: Certified Nurse Midwife

## 2017-03-02 DIAGNOSIS — Z1231 Encounter for screening mammogram for malignant neoplasm of breast: Secondary | ICD-10-CM

## 2017-03-10 ENCOUNTER — Encounter: Payer: Self-pay | Admitting: Certified Nurse Midwife

## 2017-03-10 ENCOUNTER — Ambulatory Visit (INDEPENDENT_AMBULATORY_CARE_PROVIDER_SITE_OTHER): Payer: Managed Care, Other (non HMO) | Admitting: Certified Nurse Midwife

## 2017-03-10 VITALS — BP 100/60 | HR 64 | Resp 16 | Ht 61.25 in | Wt 141.0 lb

## 2017-03-10 DIAGNOSIS — Z124 Encounter for screening for malignant neoplasm of cervix: Secondary | ICD-10-CM

## 2017-03-10 DIAGNOSIS — R195 Other fecal abnormalities: Secondary | ICD-10-CM | POA: Diagnosis not present

## 2017-03-10 DIAGNOSIS — Z0000 Encounter for general adult medical examination without abnormal findings: Secondary | ICD-10-CM

## 2017-03-10 DIAGNOSIS — Z01419 Encounter for gynecological examination (general) (routine) without abnormal findings: Secondary | ICD-10-CM | POA: Diagnosis not present

## 2017-03-10 LAB — COMPREHENSIVE METABOLIC PANEL
ALBUMIN: 3.8 g/dL (ref 3.6–5.1)
ALT: 10 U/L (ref 6–29)
AST: 16 U/L (ref 10–35)
Alkaline Phosphatase: 76 U/L (ref 33–130)
BILIRUBIN TOTAL: 0.9 mg/dL (ref 0.2–1.2)
BUN: 14 mg/dL (ref 7–25)
CHLORIDE: 103 mmol/L (ref 98–110)
CO2: 27 mmol/L (ref 20–31)
Calcium: 9.4 mg/dL (ref 8.6–10.4)
Creat: 0.91 mg/dL (ref 0.50–1.05)
Glucose, Bld: 63 mg/dL — ABNORMAL LOW (ref 65–99)
POTASSIUM: 4.6 mmol/L (ref 3.5–5.3)
Sodium: 140 mmol/L (ref 135–146)
TOTAL PROTEIN: 6.4 g/dL (ref 6.1–8.1)

## 2017-03-10 LAB — LIPID PANEL
CHOL/HDL RATIO: 2.2 ratio (ref <5.0)
Cholesterol: 231 mg/dL — ABNORMAL HIGH (ref <200)
HDL: 103 mg/dL (ref >50)
LDL Cholesterol: 118 mg/dL — ABNORMAL HIGH (ref <100)
TRIGLYCERIDES: 49 mg/dL (ref <150)
VLDL: 10 mg/dL (ref <30)

## 2017-03-10 NOTE — Progress Notes (Signed)
51 y.o. G0P0000 Married  Caucasian Fe here for annual exam. Menopausal with occasional hot flashes and some night sweats. Sleeping well now no issues. Seem to be decreasing in intensity. Sees PCP prn. Patient has noted stools are sometimes light in color and changing. Does not seem to be related to diet. No diarrhea or blood in stool, or tarry stools. Had colonoscopy with Dr. Collene Mares and one small polyp, benign. No abdominal pain. Labs here, fasted this am.. No other health issues today.  Patient's last menstrual period was 11/21/2015.          Sexually active: Yes.    The current method of family planning is post menopausal status.    Exercising: Yes.    walking Smoker:  no  Health Maintenance: Pap:  03-06-15 neg HPV HR neg MMG:  03-02-17 category c density birads 1:neg Colonoscopy:  06-17-16 benign polyp BMD:   none TDaP:  2011 Shingles: no Pneumonia: no Hep C and HIV: both neg 2017 Labs: none Self breast exam: not done   reports that she has never smoked. She has never used smokeless tobacco. She reports that she drinks about 0.6 - 1.2 oz of alcohol per week . She reports that she does not use drugs.  Past Medical History:  Diagnosis Date  . Anxiety   . Dysrhythmia    wore Holter monitor and per pt dr said he was ok with results  . Family history of adverse reaction to anesthesia    paternal great uncle took 3 days to wake up, paternal cousin CPR during surgery  . GERD (gastroesophageal reflux disease)   . Hives 2013   Chronic     Past Surgical History:  Procedure Laterality Date  . COLONOSCOPY WITH PROPOFOL    . DILATATION & CURRETTAGE/HYSTEROSCOPY WITH RESECTOCOPE N/A 08/17/2015   Procedure: DILATATION & CURETTAGE/HYSTEROSCOPY;  Surgeon: Megan Salon, MD;  Location: [facility];  Service: Gynecology;  Laterality: N/A;  . LAPAROSCOPY  1992   due to endometriosis - Lupron x 6 months    Current Outpatient Prescriptions  Medication Sig Dispense Refill  . Cholecalciferol (VITAMIN D-3)  1000 UNITS CAPS Take 1 capsule by mouth daily.    . Milk Thistle Extract 175 MG TABS Take by mouth.    . Omega-3 Fatty Acids (FISH OIL PO) Take 1 capsule by mouth daily.     Marland Kitchen OVER THE COUNTER MEDICATION Take 1 capsule by mouth daily as needed (for upset stomach/heartburn (Herbal Deglycyrrhizinated Licorice)).    . Probiotic Product (PROBIOTIC PO) Take by mouth.     No current facility-administered medications for this visit.     Family History  Problem Relation Age of Onset  . Hypertension Paternal Grandfather   . Heart attack Paternal Grandfather   . Depression Father   . Anxiety disorder Sister     ROS:  Pertinent items are noted in HPI.  Otherwise, a comprehensive ROS was negative.  Exam:   BP 100/60   Pulse 64   Resp 16   Ht 5' 1.25" (1.556 m)   Wt 141 lb (64 kg)   LMP 11/21/2015   BMI 26.42 kg/m  Height: 5' 1.25" (155.6 cm) Ht Readings from Last 3 Encounters:  03/10/17 5' 1.25" (1.556 m)  02/23/17 5\' 1"  (1.549 m)  12/07/16 5\' 1"  (1.549 m)    General appearance: alert, cooperative and appears stated age Head: Normocephalic, without obvious abnormality, atraumatic Neck: no adenopathy, supple, symmetrical, trachea midline and thyroid normal to inspection and palpation  Lungs: clear to auscultation bilaterally Breasts: normal appearance, no masses or tenderness, No nipple retraction or dimpling, No nipple discharge or bleeding, No axillary or supraclavicular adenopathy Heart: regular rate and rhythm Abdomen: soft, non-tender; no masses,  no organomegaly Extremities: extremities normal, atraumatic, no cyanosis or edema Skin: Skin color, texture, turgor normal. No rashes or lesions Lymph nodes: Cervical, supraclavicular, and axillary nodes normal. No abnormal inguinal nodes palpated Neurologic: Grossly normal   Pelvic: External genitalia:  no lesions              Urethra:  normal appearing urethra with no masses, tenderness or lesions              Bartholin's and  Skene's: normal                 Vagina: normal appearing vagina with normal color and discharge, no lesions              Cervix: no bleeding following Pap, no cervical motion tenderness and no lesions              Pap taken: Yes.   Bimanual Exam:  Uterus:  normal size, contour, position, consistency, mobility, non-tender              Adnexa: normal adnexa and no mass, fullness, tenderness               Rectovaginal: Confirms               Anus:  normal sphincter tone, no lesions  Chaperone present: yes  A:  Well Woman with normal exam  Menopausal no HRT  Stool changes, colonoscopy 2017 with one polyp  Screening labs  P:   Reviewed health and wellness pertinent to exam  Aware of need to evaluate if vaginal bleeding  Discussed will do labs today and suggest she call Dr. Collene Mares office regarding stool change to see if she needs to be seen. Patient agreeable. Warning signs given.  Labs: CMP,Lipid panel, Vitamin D  Pap smear as above   counseled on breast self exam, mammography screening, adequate intake of calcium and vitamin D, diet and exercise  return annually or prn  An After Visit Summary was printed and given to the patient.

## 2017-03-10 NOTE — Patient Instructions (Signed)

## 2017-03-11 LAB — VITAMIN D 25 HYDROXY (VIT D DEFICIENCY, FRACTURES): Vit D, 25-Hydroxy: 43 ng/mL (ref 30–100)

## 2017-03-13 LAB — IPS PAP TEST WITH REFLEX TO HPV

## 2017-03-15 ENCOUNTER — Telehealth: Payer: Self-pay

## 2017-03-15 NOTE — Telephone Encounter (Signed)
Patient notified of results. See lab 

## 2017-03-15 NOTE — Telephone Encounter (Signed)
lmtcb

## 2017-03-15 NOTE — Telephone Encounter (Signed)
-----   Message from Regina Eck, CNM sent at 03/15/2017  8:04 AM EDT ----- Pap smear reviewed negative. Atrophy noted 02 Vitamin D is normal continue daily supplement Cholesterol is up from last year at 207 to 231 Triglycerides  Normal at 49 HDL is 103 which is great LDL is 188 up from 102 last year. Need to work on increase in fiber in diet, decrease in refined sugar and salt enhanced foods, increase lean meat, chicken, fish in diet, along with regular exercise Repeat with next aex, fasting Liver, kidney and glucose profile is normal, but glucose slightly low make sure to eat breakfast daily. Was she fasting with these labs, if so not at issue at this point.

## 2017-03-18 NOTE — Progress Notes (Signed)
Encounter reviewed Aspen Deterding, MD   

## 2017-03-30 ENCOUNTER — Other Ambulatory Visit: Payer: Managed Care, Other (non HMO) | Admitting: Sports Medicine

## 2018-03-01 ENCOUNTER — Other Ambulatory Visit: Payer: Self-pay | Admitting: Obstetrics & Gynecology

## 2018-03-01 DIAGNOSIS — Z1231 Encounter for screening mammogram for malignant neoplasm of breast: Secondary | ICD-10-CM

## 2018-03-08 ENCOUNTER — Encounter (HOSPITAL_BASED_OUTPATIENT_CLINIC_OR_DEPARTMENT_OTHER): Payer: Self-pay

## 2018-03-08 ENCOUNTER — Ambulatory Visit (HOSPITAL_BASED_OUTPATIENT_CLINIC_OR_DEPARTMENT_OTHER)
Admission: RE | Admit: 2018-03-08 | Discharge: 2018-03-08 | Disposition: A | Discharge status: Home / Self Care | Payer: Managed Care, Other (non HMO) | Source: Ambulatory Visit | Attending: Obstetrics & Gynecology | Admitting: Obstetrics & Gynecology

## 2018-03-08 DIAGNOSIS — Z1231 Encounter for screening mammogram for malignant neoplasm of breast: Secondary | ICD-10-CM | POA: Diagnosis not present

## 2018-03-13 ENCOUNTER — Encounter: Payer: Self-pay | Admitting: Certified Nurse Midwife

## 2018-03-13 ENCOUNTER — Other Ambulatory Visit: Payer: Self-pay

## 2018-03-13 ENCOUNTER — Ambulatory Visit (INDEPENDENT_AMBULATORY_CARE_PROVIDER_SITE_OTHER): Payer: Managed Care, Other (non HMO) | Admitting: Certified Nurse Midwife

## 2018-03-13 VITALS — BP 90/60 | HR 68 | Resp 16 | Ht 61.25 in | Wt 137.0 lb

## 2018-03-13 DIAGNOSIS — Z01419 Encounter for gynecological examination (general) (routine) without abnormal findings: Secondary | ICD-10-CM

## 2018-03-13 DIAGNOSIS — R194 Change in bowel habit: Secondary | ICD-10-CM | POA: Diagnosis not present

## 2018-03-13 DIAGNOSIS — E041 Nontoxic single thyroid nodule: Secondary | ICD-10-CM | POA: Diagnosis not present

## 2018-03-13 DIAGNOSIS — N951 Menopausal and female climacteric states: Secondary | ICD-10-CM

## 2018-03-13 DIAGNOSIS — Z0000 Encounter for general adult medical examination without abnormal findings: Secondary | ICD-10-CM | POA: Diagnosis not present

## 2018-03-13 NOTE — Patient Instructions (Signed)

## 2018-03-13 NOTE — Progress Notes (Signed)
52 y.o. G0P0000 Married  Caucasian Fe here for annual exam. Menopausal no vaginal bleeding noted. Hot flashes occasional, but no sleep issues. Patient has noted stomach issues with diarrhea and constipation back and forth. Has taken probiotic for years, has added sauerkraut to help with this with no change. Had sudden onset of change. No blood in stool, but stools are grey in color, soft, not hard at times also. Some cramping and gas, some bloating. Some burping with occasional nausea.. Has noted dry hair and skin occurrence also. No vaginal dryness that she is aware of. Would like to have this resolved.  No other health issues today. Screening labs if needed  Patient's last menstrual period was 11/21/2015.          Sexually active: Yes.    The current method of family planning is post menopausal status.    Exercising: Yes.    tennis, yoga, walking Smoker:  no  Health Maintenance: Pap:  03-06-15 neg HPV HR neg, 03-10-17 neg History of Abnormal Pap: no MMG:  03-08-18 category c density birads 1:neg Self Breast exams: occ Colonoscopy:  2017 polyps BMD:   none TDaP:  2011 Shingles: no Pneumonia: no Hep C and HIV: both neg 2017 Labs: yes   reports that she has never smoked. She has never used smokeless tobacco. She reports that she drinks alcohol. She reports that she does not use drugs.  Past Medical History:  Diagnosis Date  . Anxiety   . Dysrhythmia    wore Holter monitor and per pt dr said he was ok with results  . Family history of adverse reaction to anesthesia    paternal great uncle took 3 days to wake up, paternal cousin CPR during surgery  . GERD (gastroesophageal reflux disease)   . Hives 2013   Chronic     Past Surgical History:  Procedure Laterality Date  . COLONOSCOPY WITH PROPOFOL    . DILATATION & CURRETTAGE/HYSTEROSCOPY WITH RESECTOCOPE N/A 08/17/2015   Procedure: DILATATION & CURETTAGE/HYSTEROSCOPY;  Surgeon: Megan Salon, MD;  Location: [facility];  Service: Gynecology;   Laterality: N/A;  . LAPAROSCOPY  1992   due to endometriosis - Lupron x 6 months    Current Outpatient Medications  Medication Sig Dispense Refill  . b complex vitamins capsule     . Cholecalciferol (VITAMIN D3) 2000 units capsule     . Milk Thistle Extract 175 MG TABS Take by mouth.    . Omega-3 Fatty Acids (FISH OIL PO) Take 1 capsule by mouth daily.     Marland Kitchen OVER THE COUNTER MEDICATION Take 1 capsule by mouth daily as needed (for upset stomach/heartburn (Herbal Deglycyrrhizinated Licorice)).    . Probiotic Product (PROBIOTIC PO) Take by mouth.     No current facility-administered medications for this visit.     Family History  Problem Relation Age of Onset  . Hypertension Paternal Grandfather   . Heart attack Paternal Grandfather   . Depression Father   . Anxiety disorder Sister     ROS:  Pertinent items are noted in HPI.  Otherwise, a comprehensive ROS was negative.  Exam:   BP 90/60   Pulse 68   Resp 16   Ht 5' 1.25" (1.556 m)   Wt 137 lb (62.1 kg)   LMP 11/21/2015   BMI 25.68 kg/m  Height: 5' 1.25" (155.6 cm) Ht Readings from Last 3 Encounters:  03/13/18 5' 1.25" (1.556 m)  03/10/17 5' 1.25" (1.556 m)  02/23/17 5\' 1"  (1.549 m)  General appearance: alert, cooperative and appears stated age Head: Normocephalic, without obvious abnormality, atraumatic Neck: no adenopathy, supple, symmetrical, trachea midline and thyroid nodule noted on right, non tender Lungs: clear to auscultation bilaterally Breasts: normal appearance, no masses or tenderness, No nipple retraction or dimpling, No nipple discharge or bleeding, No axillary or supraclavicular adenopathy Heart: regular rate and rhythm Abdomen: soft, non-tender; no masses,  no organomegaly Extremities: extremities normal, atraumatic, no cyanosis or edema Skin: Skin color, texture, turgor normal. No rashes or lesions Lymph nodes: Cervical, supraclavicular, and axillary nodes normal. No abnormal inguinal nodes  palpated Neurologic: Grossly normal   Pelvic: External genitalia:  no lesions              Urethra:  normal appearing urethra with no masses, tenderness or lesions              Bartholin's and Skene's: normal                 Vagina: normal appearing vagina with normal color and discharge, no lesions              Cervix: no bleeding following Pap, no cervical motion tenderness and no lesions              Pap taken: No. Bimanual Exam:  Uterus:  normal size, contour, position, consistency, mobility, non-tender and anteverted              Adnexa: normal adnexa and no mass, fullness, tenderness               Rectovaginal: Confirms               Anus:  normal sphincter tone, no lesions  Chaperone present: yes  A:  Well Woman with normal exam  Menopausal no HRT  Right thyroid nodule  Bowel habit changes with cramping and nausea ( has seen Dr. Collene Mares)  Screening labs  P:   Reviewed health and wellness pertinent to exam  Aware of need to advise if vaginal bleeding  Discussed thyroid nodule finding and need for evaluation with [place]. Patient will be called with information regarding appointment. Questions addressed.  Discussed due bowel movement changes would recommend appointment with Dr. Collene Mares( saw for colonoscopy). Patient agreeable. Will schedule prior to leaving. Warning signs of stool changes given. Questions addressed.  Lab: CBC, CMP,Lipid panel, TSH with panel,Vitamin D  Pap smear: no   counseled on breast self exam, mammography screening, feminine hygiene, menopause, adequate intake of calcium and vitamin D, diet and exercise  return annually or prn  An After Visit Summary was printed and given to the patient.

## 2018-03-13 NOTE — Progress Notes (Signed)
Spoke with Lattie Haw. Patient scheduled while in office with Dr. Collene Mares on 03/22/18 at 2pm. Fax copy of OV notes to [phone].

## 2018-03-13 NOTE — Progress Notes (Signed)
OV notes dated 03/13/18 faxed to Dr. Collene Mares at [phone].

## 2018-03-14 ENCOUNTER — Telehealth: Payer: Self-pay | Admitting: Certified Nurse Midwife

## 2018-03-14 DIAGNOSIS — E041 Nontoxic single thyroid nodule: Secondary | ICD-10-CM

## 2018-03-14 LAB — COMPREHENSIVE METABOLIC PANEL
A/G RATIO: 1.8 (ref 1.2–2.2)
ALBUMIN: 4.2 g/dL (ref 3.5–5.5)
ALT: 16 IU/L (ref 0–32)
AST: 20 IU/L (ref 0–40)
Alkaline Phosphatase: 91 IU/L (ref 39–117)
BUN / CREAT RATIO: 11 (ref 9–23)
BUN: 11 mg/dL (ref 6–24)
Bilirubin Total: 0.5 mg/dL (ref 0.0–1.2)
CHLORIDE: 103 mmol/L (ref 96–106)
CO2: 28 mmol/L (ref 20–29)
Calcium: 9.7 mg/dL (ref 8.7–10.2)
Creatinine, Ser: 0.96 mg/dL (ref 0.57–1.00)
GFR calc non Af Amer: 69 mL/min/{1.73_m2} (ref >59)
GFR, EST AFRICAN AMERICAN: 79 mL/min/{1.73_m2} (ref >59)
GLOBULIN, TOTAL: 2.3 g/dL (ref 1.5–4.5)
GLUCOSE: 83 mg/dL (ref 65–99)
Potassium: 5.2 mmol/L (ref 3.5–5.2)
SODIUM: 145 mmol/L — AB (ref 134–144)
TOTAL PROTEIN: 6.5 g/dL (ref 6.0–8.5)

## 2018-03-14 LAB — CBC
HEMATOCRIT: 46.4 % (ref 34.0–46.6)
Hemoglobin: 15.5 g/dL (ref 11.1–15.9)
MCH: 29.8 pg (ref 26.6–33.0)
MCHC: 33.4 g/dL (ref 31.5–35.7)
MCV: 89 fL (ref 79–97)
PLATELETS: 200 10*3/uL (ref 150–379)
RBC: 5.21 x10E6/uL (ref 3.77–5.28)
RDW: 13.8 % (ref 12.3–15.4)
WBC: 6.5 10*3/uL (ref 3.4–10.8)

## 2018-03-14 LAB — LIPID PANEL
CHOLESTEROL TOTAL: 195 mg/dL (ref 100–199)
Chol/HDL Ratio: 3 ratio (ref 0.0–4.4)
HDL: 66 mg/dL (ref >39)
LDL CALC: 116 mg/dL — AB (ref 0–99)
TRIGLYCERIDES: 64 mg/dL (ref 0–149)
VLDL CHOLESTEROL CAL: 13 mg/dL (ref 5–40)

## 2018-03-14 LAB — VITAMIN D 25 HYDROXY (VIT D DEFICIENCY, FRACTURES): Vit D, 25-Hydroxy: 61.4 ng/mL (ref 30.0–100.0)

## 2018-03-14 LAB — THYROID PANEL WITH TSH
FREE THYROXINE INDEX: 2 (ref 1.2–4.9)
T3 Uptake Ratio: 27 % (ref 24–39)
T4, Total: 7.4 ug/dL (ref 4.5–12.0)
TSH: 0.873 u[IU]/mL (ref 0.450–4.500)

## 2018-03-14 NOTE — Telephone Encounter (Signed)
Patient called to check on the status of scheduling her thyroid imaging at [place]. She was seen by Melvia Heaps, CNM yesterday and said she thought she'd hear from [place] about this within a few hours.  Cc: Suzy/Rosa

## 2018-03-14 NOTE — Telephone Encounter (Signed)
Spoke with [place] at [place]. Thyroid US scheduled for 03/19/18 at 3:15pm, arriving at 2:55pm. [address] location.   Call returned to patient. Advised of appointment details as seen above. Patient verbalizes understanding and is agreeable.   Routing to provider for final review. Patient is agreeable to disposition. Will close encounter.   Cc: Lerry Liner, Magdalene Patricia

## 2018-03-14 NOTE — Addendum Note (Signed)
Addended by: Regina Eck on: 03/14/2018 07:36 AM   Modules accepted: Orders

## 2018-03-14 NOTE — Telephone Encounter (Signed)
Spoke with patient. Patient requesting to schedule recommended thyroid US, has not received call from [place]. Advised patient RN can call to schedule and return call, patient agreeable.   New order placed for US thyroid.

## 2018-03-18 ENCOUNTER — Encounter: Payer: Self-pay | Admitting: Certified Nurse Midwife

## 2018-03-19 ENCOUNTER — Ambulatory Visit
Admission: RE | Admit: 2018-03-19 | Discharge: 2018-03-19 | Disposition: A | Discharge status: Home / Self Care | Payer: Managed Care, Other (non HMO) | Source: Ambulatory Visit | Attending: Certified Nurse Midwife | Admitting: Certified Nurse Midwife

## 2018-03-19 ENCOUNTER — Telehealth: Payer: Self-pay | Admitting: Certified Nurse Midwife

## 2018-03-19 DIAGNOSIS — E041 Nontoxic single thyroid nodule: Secondary | ICD-10-CM

## 2018-03-19 NOTE — Telephone Encounter (Signed)
Patient sent the following correspondence through [place]. Routing to triage to assist patient with request.  ----- Message from [place], Generic sent at 03/18/2018 8:03 PM EDT -----    Hi Joy,  Thank you for calling last week with a summary of my lab results. When will they be available on MyChart?    Thanks!  Laurie Parsons

## 2018-03-19 NOTE — Telephone Encounter (Signed)
See MyChart message to patient. Lab results dated 03/13/18 released to [facility].  Routing to provider for final review. Patient is agreeable to disposition. Will close encounter.

## 2018-09-20 ENCOUNTER — Telehealth: Payer: Self-pay | Admitting: Certified Nurse Midwife

## 2018-09-20 NOTE — Telephone Encounter (Signed)
Spoke with patient.  Has had hemorroids for two weeks. Not sure what she should do. Advised can see PCP, GYN or Dr. Collene Mares.  Pt would like to try options that are more conservative so she would like to see Laurie Parsons first and if necessary will go to see Dr. Collene Mares. Pt given appointment for tomorrow with Laurie Parsons CNM. Encounter closed.

## 2018-09-20 NOTE — Telephone Encounter (Signed)
Patient stated that she has had hemorrhoids that she "cannot get rid of." Patient is calling to find out whether or not she should be seen at [facility] or at her pcp.

## 2018-09-21 ENCOUNTER — Encounter: Payer: Self-pay | Admitting: Certified Nurse Midwife

## 2018-09-21 ENCOUNTER — Ambulatory Visit: Payer: Managed Care, Other (non HMO) | Admitting: Certified Nurse Midwife

## 2018-09-21 ENCOUNTER — Other Ambulatory Visit: Payer: Self-pay

## 2018-09-21 VITALS — BP 102/64 | HR 68 | Resp 16 | Wt 134.0 lb

## 2018-09-21 DIAGNOSIS — K648 Other hemorrhoids: Secondary | ICD-10-CM

## 2018-09-21 MED ORDER — HYDROCORTISONE ACETATE 25 MG RE SUPP: RECTAL | 1 refills | Status: DC

## 2018-09-21 NOTE — Patient Instructions (Signed)
Hemorrhoids Hemorrhoids are swollen veins in and around the rectum or anus. There are two types of hemorrhoids:  Internal hemorrhoids. These occur in the veins that are just inside the rectum. They may poke through to the outside and become irritated and painful.  External hemorrhoids. These occur in the veins that are outside of the anus and can be felt as a painful swelling or hard lump near the anus.  Most hemorrhoids do not cause serious problems, and they can be managed with home treatments such as diet and lifestyle changes. If home treatments do not help your symptoms, procedures can be done to shrink or remove the hemorrhoids. What are the causes? This condition is caused by increased pressure in the anal area. This pressure may result from various things, including:  Constipation.  Straining to have a bowel movement.  Diarrhea.  Pregnancy.  Obesity.  Sitting for long periods of time.  Heavy lifting or other activity that causes you to strain.  Anal sex.  What are the signs or symptoms? Symptoms of this condition include:  Pain.  Anal itching or irritation.  Rectal bleeding.  Leakage of stool (feces).  Anal swelling.  One or more lumps around the anus.  How is this diagnosed? This condition can often be diagnosed through a visual exam. Other exams or tests may also be done, such as:  Examination of the rectal area with a gloved hand (digital rectal exam).  Examination of the anal canal using a small tube (anoscope).  A blood test, if you have lost a significant amount of blood.  A test to look inside the colon (sigmoidoscopy or colonoscopy).  How is this treated? This condition can usually be treated at home. However, various procedures may be done if dietary changes, lifestyle changes, and other home treatments do not help your symptoms. These procedures can help make the hemorrhoids smaller or remove them completely. Some of these procedures involve  surgery, and others do not. Common procedures include:  Rubber band ligation. Rubber bands are placed at the base of the hemorrhoids to cut off the blood supply to them.  Sclerotherapy. Medicine is injected into the hemorrhoids to shrink them.  Infrared coagulation. A type of light energy is used to get rid of the hemorrhoids.  Hemorrhoidectomy surgery. The hemorrhoids are surgically removed, and the veins that supply them are tied off.  Stapled hemorrhoidopexy surgery. A circular stapling device is used to remove the hemorrhoids and use staples to cut off the blood supply to them.  Follow these instructions at home: Eating and drinking  Eat foods that have a lot of fiber in them, such as whole grains, beans, nuts, fruits, and vegetables. Ask your health care provider about taking products that have added fiber (fiber supplements).  Drink enough fluid to keep your urine clear or pale yellow. Managing pain and swelling  Take warm sitz baths for 20 minutes, 3-4 times a day to ease pain and discomfort.  If directed, apply ice to the affected area. Using ice packs between sitz baths may be helpful. ? Put ice in a plastic bag. ? Place a towel between your skin and the bag. ? Leave the ice on for 20 minutes, 2-3 times a day. General instructions  Take over-the-counter and prescription medicines only as told by your health care provider.  Use medicated creams or suppositories as told.  Exercise regularly.  Go to the bathroom when you have the urge to have a bowel movement. Do not wait.    Avoid straining to have bowel movements.  Keep the anal area dry and clean. Use wet toilet paper or moist towelettes after a bowel movement.  Do not sit on the toilet for long periods of time. This increases blood pooling and pain. Contact a health care provider if:  You have increasing pain and swelling that are not controlled by treatment or medicine.  You have uncontrolled bleeding.  You  have difficulty having a bowel movement, or you are unable to have a bowel movement.  You have pain or inflammation outside the area of the hemorrhoids. This information is not intended to replace advice given to you by your health care provider. Make sure you discuss any questions you have with your health care provider. Document Released: 11/11/2000 Document Revised: 04/13/2016 Document Reviewed: 07/29/2015 Elsevier Interactive Patient Education  2018 Elsevier Inc.  

## 2018-09-21 NOTE — Progress Notes (Signed)
  Subjective:     Patient ID: Laurie Parsons, female   DOB: 12-May-1966, 52 y.o.   MRN: [id number]  Patient here complaining of hemorrhoidal pain for 1-2 weeks, with occasional bleeding with just pink. No hard stools or constipation. Has been standing for prolonged periods of time and sitting tilted because of the discomfort. Using witch hazel compresses and essential oil use to area, but still not resolving. Saw Dr. Collene Mares and was unhappy with her visit regarding GI issues.. Sought functional treatment physician in [facility] and feel this is really helping her now. Does not want to take Colace because she is making progress with GI issues. No other health issues today.    Review of Systems  Constitutional: Positive for activity change. Negative for appetite change.       Diet change  HENT: Negative.   Eyes: Negative.   Respiratory: Negative.   Cardiovascular: Negative.   Gastrointestinal: Positive for anal bleeding and rectal pain. Negative for abdominal distention, abdominal pain, blood in stool, constipation, diarrhea, nausea and vomiting.  Genitourinary: Negative.   Musculoskeletal: Negative.   Skin: Negative.   Allergic/Immunologic: Negative.   Neurological: Negative.   Hematological: Negative.   Psychiatric/Behavioral: Negative.        Objective:   Physical Exam  Constitutional: She is oriented to person, place, and time. She appears well-developed and well-nourished.  Abdominal: Soft. Bowel sounds are normal.  Genitourinary: Rectal exam shows internal hemorrhoid. Rectal exam shows no external hemorrhoid, no fissure and no tenderness.  Genitourinary Comments: Rectal area no visible hemorrhoids around or in anal opening. Anal scope inserted with lubrication and small non thrombosed hemorrhoid noted at 6 o'clock, no bleeding noted. Tender to touch, no bleeding noted.   Neurological: She is alert and oriented to person, place, and time.  Skin: Skin is warm and dry.   Psychiatric: She has a normal mood and affect. Her behavior is normal. Judgment and thought content normal.       Assessment:     Non thrombosed internal hemorrhoid in rectum    Plan:     Discussed finding with patient and need to treat with medication for comfort and resolution of hemorrhoid discomfort. Etiology of hemorrhoid given. Discussed importance of soft stools with Colace use as needed to decrease discomfort and pain. Warm tub bath or cold compress can be used for comfort. Avoid prolonged sitting or standing which increases pressure in rectal area. Warning signs with bleeding and stool change discussed and need to advise. Questions addressed. Rx Anusol HC suppositories bid with one of the two at bedtime. See order  Rv prn

## 2018-09-28 ENCOUNTER — Telehealth: Payer: Self-pay | Admitting: Certified Nurse Midwife

## 2018-09-28 NOTE — Telephone Encounter (Signed)
Noted message. Will route to Debbi and respond to patient via mychart.  Encounter closed.

## 2018-09-28 NOTE — Telephone Encounter (Signed)
Patient sent the following correspondence through [place]. Routing to triage for review and routing to provider.  Hi Miss Laurie Parsons,   I just wanted to let you know that my hemorrhoids are no longer painful - the suppositories did the trick. Thank you so much! I hope you have a good weekend!    Laurie Parsons

## 2018-12-31 DIAGNOSIS — R768 Other specified abnormal immunological findings in serum: Secondary | ICD-10-CM | POA: Insufficient documentation

## 2019-03-26 ENCOUNTER — Ambulatory Visit: Payer: Managed Care, Other (non HMO) | Admitting: Certified Nurse Midwife

## 2019-04-02 DIAGNOSIS — F418 Other specified anxiety disorders: Secondary | ICD-10-CM | POA: Insufficient documentation

## 2019-05-29 NOTE — Progress Notes (Signed)
53 y.o. G0P0000 Married  Caucasian Fe here for annual exam. Menopausal no HRT, no issues with hot flashes or night sweats. Denies vaginal bleeding. Has been having some urinary frequency, urgency feels be related to dryness. Denies fever, chills or back pain. Did not feel she needed treatment. Using fractioned coconut oil for dryness and this has helped, but urgency continues.. Had episode of anxiety and lost weight. Was seen at [facility] by Functional medicine and Rheumatology last year no auto immune disease found. Anxiety increased earlier this year with Covid 19 and has lost 18 pounds, but eating well now and feeling much better. Spouse supportive. No other health issues today. Screening labs requested.  Patient's last menstrual period was 11/21/2015.          Sexually active: No.  The current method of family planning is post menopausal status.    Exercising: Yes.    walking, stretching, strength Smoker:  no  Review of Systems  Constitutional: Negative.   HENT: Negative.   Eyes: Negative.   Respiratory: Negative.   Cardiovascular: Negative.   Gastrointestinal: Negative.   Genitourinary: Negative.   Musculoskeletal: Negative.   Skin:       Vaginal dryness, dry mouth, hemorrhoids  Neurological: Negative.   Endo/Heme/Allergies: Negative.   Psychiatric/Behavioral: Negative.     Health Maintenance: Pap:  03-10-17 neg History of Abnormal Pap: no MMG:  03-08-18 category c density birads 1:neg Self Breast exams: no Colonoscopy:  2017 polyps 5 years BMD:   none TDaP:  2011 Shingles: no Pneumonia: no Hep C and HIV: both neg 2017 Labs: yes   reports that she has never smoked. She has never used smokeless tobacco. She reports current alcohol use. She reports that she does not use drugs.  Past Medical History:  Diagnosis Date  . Anxiety   . Dysrhythmia    wore Holter monitor and per pt dr said he was ok with results  . Family history of adverse reaction to anesthesia    paternal  great uncle took 3 days to wake up, paternal cousin CPR during surgery  . GERD (gastroesophageal reflux disease)   . Hives 2013   Chronic     Past Surgical History:  Procedure Laterality Date  . COLONOSCOPY WITH PROPOFOL    . DILATATION & CURRETTAGE/HYSTEROSCOPY WITH RESECTOCOPE N/A 08/17/2015   Procedure: DILATATION & CURETTAGE/HYSTEROSCOPY;  Surgeon: Megan Salon, MD;  Location: [facility];  Service: Gynecology;  Laterality: N/A;  . LAPAROSCOPY  1992   due to endometriosis - Lupron x 6 months    Current Outpatient Medications  Medication Sig Dispense Refill  . Ascorbic Acid (VITAMIN C PO) Take by mouth.    Marland Kitchen b complex vitamins capsule     . Cholecalciferol (VITAMIN D3) 2000 units capsule     . hydrocortisone (ANUSOL-HC) 25 MG suppository Insert rectally twice daily, one of these at night 14 suppository 1  . Multiple Vitamins-Minerals (ZINC PO) Take by mouth.    . Omega-3 Fatty Acids (FISH OIL PO) Take 1 capsule by mouth daily.     . Probiotic Product (PROBIOTIC PO) Take by mouth.    Marland Kitchen UNABLE TO FIND 2 (two) times daily. Butyrate with calcium & magnesium     No current facility-administered medications for this visit.     Family History  Problem Relation Age of Onset  . Hypertension Paternal Grandfather   . Heart attack Paternal Grandfather   . Depression Father   . Anxiety disorder Sister  ROS:  Pertinent items are noted in HPI.  Otherwise, a comprehensive ROS was negative.  Exam:   LMP 11/21/2015    Ht Readings from Last 3 Encounters:  03/13/18 5' 1.25" (1.556 m)  03/10/17 5' 1.25" (1.556 m)  02/23/17 5\' 1"  (1.549 m)    General appearance: alert, cooperative and appears stated age Head: Normocephalic, without obvious abnormality, atraumatic Neck: no adenopathy, supple, symmetrical, trachea midline and thyroid nodular, known Lungs: clear to auscultation bilaterally CVAT: bilateral negative Breasts: normal appearance, no masses or tenderness, No nipple retraction  or dimpling, No nipple discharge or bleeding, No axillary or supraclavicular adenopathy, shotty feel bilateral no change Heart: regular rate and rhythm Abdomen: soft, non-tender; no masses,  no organomegaly, no rebound tenderness, negative suprapubic Extremities: extremities normal, atraumatic, no cyanosis or edema Skin: Skin color, texture, turgor normal. No rashes or lesions Lymph nodes: Cervical, supraclavicular, and axillary nodes normal. No abnormal inguinal nodes palpated Neurologic: Grossly normal   Pelvic: External genitalia:  no lesions, normal female, slight atrophy              Urethra:  normal appearing urethra with no masses, tenderness or lesions              Bartholin's and Skene's: normal                 Vagina: normal appearing vagina with normal color and discharge, no lesions              Cervix: no cervical motion tenderness, no lesions and normal appearance              Pap taken: Yes.   Bimanual Exam:  Uterus:  normal size, contour, position, consistency, mobility, non-tender and anteverted              Adnexa: normal adnexa and no mass, fullness, tenderness               Rectovaginal: Confirms               Anus:  normal sphincter tone, no lesions, no hemorrhoids noted  Chaperone present: yes  A:  Well Woman with normal exam  Menopausal no HRT  Vaginal dryness using coconut oil with good response  R/O UTI  Positive inflammation markers last year, has not gone back for follow up  Dry mouth  Anxiety no medication use  Screening labs    P:   Reviewed health and wellness pertinent to exam  Aware of need to advise if vaginal bleeding  Discussed using on routine basis and prn should work well.   Warning signs of UTI given, need to increase water and vaginal dryness can contribute.  Lab: Urine culture  Encouraged to follow up with Rheumatology  Encouraged to discuss with dentist, but can try OTC products such as Biotene for relief.  Stressed follow up with MD  if continues  Lab: CMP,CBC,Lipid panel, TSH, Vitamin D  Pap smear: yes   counseled on breast self exam, mammography screening, feminine hygiene, adequate intake of calcium and vitamin D, diet and exercise  return annually or prn  An After Visit Summary was printed and given to the patient.

## 2019-06-03 ENCOUNTER — Ambulatory Visit (INDEPENDENT_AMBULATORY_CARE_PROVIDER_SITE_OTHER): Payer: Managed Care, Other (non HMO) | Admitting: Certified Nurse Midwife

## 2019-06-03 ENCOUNTER — Other Ambulatory Visit: Payer: Self-pay

## 2019-06-03 ENCOUNTER — Other Ambulatory Visit (HOSPITAL_COMMUNITY)
Admission: RE | Admit: 2019-06-03 | Discharge: 2019-06-03 | Disposition: A | Discharge status: Home / Self Care | Payer: Managed Care, Other (non HMO) | Source: Ambulatory Visit | Attending: Obstetrics & Gynecology | Admitting: Obstetrics & Gynecology

## 2019-06-03 ENCOUNTER — Encounter: Payer: Self-pay | Admitting: Certified Nurse Midwife

## 2019-06-03 VITALS — BP 100/62 | HR 64 | Temp 97.3°F | Resp 16 | Ht 61.25 in | Wt 119.0 lb

## 2019-06-03 DIAGNOSIS — Z01419 Encounter for gynecological examination (general) (routine) without abnormal findings: Secondary | ICD-10-CM | POA: Diagnosis not present

## 2019-06-03 DIAGNOSIS — R3915 Urgency of urination: Secondary | ICD-10-CM | POA: Diagnosis not present

## 2019-06-03 DIAGNOSIS — E559 Vitamin D deficiency, unspecified: Secondary | ICD-10-CM | POA: Diagnosis not present

## 2019-06-03 DIAGNOSIS — Z124 Encounter for screening for malignant neoplasm of cervix: Secondary | ICD-10-CM | POA: Insufficient documentation

## 2019-06-03 DIAGNOSIS — Z0000 Encounter for general adult medical examination without abnormal findings: Secondary | ICD-10-CM

## 2019-06-04 LAB — COMPREHENSIVE METABOLIC PANEL
ALT: 16 IU/L (ref 0–32)
AST: 18 IU/L (ref 0–40)
Albumin/Globulin Ratio: 1.8 (ref 1.2–2.2)
Albumin: 4.5 g/dL (ref 3.8–4.9)
Alkaline Phosphatase: 84 IU/L (ref 39–117)
BUN/Creatinine Ratio: 15 (ref 9–23)
BUN: 15 mg/dL (ref 6–24)
Bilirubin Total: 0.6 mg/dL (ref 0.0–1.2)
CO2: 26 mmol/L (ref 20–29)
Calcium: 10.2 mg/dL (ref 8.7–10.2)
Chloride: 104 mmol/L (ref 96–106)
Creatinine, Ser: 1.03 mg/dL — ABNORMAL HIGH (ref 0.57–1.00)
GFR calc Af Amer: 72 mL/min/{1.73_m2} (ref >59)
GFR calc non Af Amer: 62 mL/min/{1.73_m2} (ref >59)
Globulin, Total: 2.5 g/dL (ref 1.5–4.5)
Glucose: 81 mg/dL (ref 65–99)
Potassium: 5 mmol/L (ref 3.5–5.2)
Sodium: 146 mmol/L — ABNORMAL HIGH (ref 134–144)
Total Protein: 7 g/dL (ref 6.0–8.5)

## 2019-06-04 LAB — CYTOLOGY - PAP
Diagnosis: NEGATIVE
HPV: NOT DETECTED

## 2019-06-04 LAB — CBC
Hematocrit: 48.3 % — ABNORMAL HIGH (ref 34.0–46.6)
Hemoglobin: 15.9 g/dL (ref 11.1–15.9)
MCH: 31 pg (ref 26.6–33.0)
MCHC: 32.9 g/dL (ref 31.5–35.7)
MCV: 94 fL (ref 79–97)
Platelets: 179 10*3/uL (ref 150–450)
RBC: 5.13 x10E6/uL (ref 3.77–5.28)
RDW: 13.1 % (ref 11.7–15.4)
WBC: 6.4 10*3/uL (ref 3.4–10.8)

## 2019-06-04 LAB — LIPID PANEL
Chol/HDL Ratio: 2.5 ratio (ref 0.0–4.4)
Cholesterol, Total: 247 mg/dL — ABNORMAL HIGH (ref 100–199)
HDL: 97 mg/dL (ref >39)
LDL Calculated: 136 mg/dL — ABNORMAL HIGH (ref 0–99)
Triglycerides: 69 mg/dL (ref 0–149)
VLDL Cholesterol Cal: 14 mg/dL (ref 5–40)

## 2019-06-04 LAB — VITAMIN D 25 HYDROXY (VIT D DEFICIENCY, FRACTURES): Vit D, 25-Hydroxy: 44.5 ng/mL (ref 30.0–100.0)

## 2019-06-04 LAB — TSH: TSH: 1.85 u[IU]/mL (ref 0.450–4.500)

## 2019-06-05 LAB — URINE CULTURE

## 2019-06-07 ENCOUNTER — Telehealth: Payer: Self-pay | Admitting: Certified Nurse Midwife

## 2019-06-07 NOTE — Telephone Encounter (Signed)
Patient would like to view her most recent lab results on MyChart and they are not available yet.

## 2019-06-07 NOTE — Telephone Encounter (Signed)
Patient notified labs released to [facility].

## 2019-08-27 ENCOUNTER — Other Ambulatory Visit (HOSPITAL_BASED_OUTPATIENT_CLINIC_OR_DEPARTMENT_OTHER): Payer: Self-pay | Admitting: Obstetrics & Gynecology

## 2019-08-27 DIAGNOSIS — Z1231 Encounter for screening mammogram for malignant neoplasm of breast: Secondary | ICD-10-CM

## 2019-08-28 ENCOUNTER — Ambulatory Visit (HOSPITAL_BASED_OUTPATIENT_CLINIC_OR_DEPARTMENT_OTHER)
Admission: RE | Admit: 2019-08-28 | Discharge: 2019-08-28 | Disposition: A | Discharge status: Home / Self Care | Payer: Managed Care, Other (non HMO) | Source: Ambulatory Visit | Attending: Obstetrics & Gynecology | Admitting: Obstetrics & Gynecology

## 2019-08-28 ENCOUNTER — Other Ambulatory Visit: Payer: Self-pay

## 2019-08-28 DIAGNOSIS — Z1231 Encounter for screening mammogram for malignant neoplasm of breast: Secondary | ICD-10-CM | POA: Diagnosis not present

## 2019-12-06 ENCOUNTER — Other Ambulatory Visit: Payer: Managed Care, Other (non HMO)

## 2020-02-10 ENCOUNTER — Telehealth: Payer: Self-pay

## 2020-02-10 NOTE — Telephone Encounter (Signed)
Patient wants to speak to a nurse regarding problems with hemroids and rectal bleeding.

## 2020-02-10 NOTE — Telephone Encounter (Signed)
Spoke with patient. Patient reports RLQ abdominal/pelvic pain, bloating, some distention for the past several weeks. Pain currently 4/10. Reports one episode if rectal bleeding last wk. Reports hx of hemorrhoids, denies bleeding or rectal pain at this time. Reports BMs have been normal, Hx of constipation takes fiber supplement when this occurs. Denies vaginal bleeding, urinary symptoms, fever/chills, N/V or diarrhea. Patient states she has seen GI in the past for colonoscopy, would like to discuss alternative provider, if needed. Patient is requesting OV. Recommended OV with MD for further evaluation, patient agreeable. OV scheduled for 3/17 at 4pm with Dr. Talbert Nan. Patient declined OV offered for today, ER precautions reviewed for new or worsening symptoms. Advised I will review with provider and return call if any additional recommendations. Patient is agreeable.   Last AEX 03/13/18  Routing to provider for final review. Patient is agreeable to disposition. Will close encounter.  Cc: Melvia Heaps, CNM

## 2020-02-12 ENCOUNTER — Ambulatory Visit (INDEPENDENT_AMBULATORY_CARE_PROVIDER_SITE_OTHER): Payer: Self-pay | Admitting: Obstetrics and Gynecology

## 2020-02-12 ENCOUNTER — Other Ambulatory Visit: Payer: Self-pay

## 2020-02-12 ENCOUNTER — Encounter: Payer: Self-pay | Admitting: Obstetrics and Gynecology

## 2020-02-12 VITALS — BP 102/62 | HR 83 | Temp 98.1°F | Ht 61.0 in | Wt 124.0 lb

## 2020-02-12 DIAGNOSIS — N941 Unspecified dyspareunia: Secondary | ICD-10-CM

## 2020-02-12 DIAGNOSIS — N952 Postmenopausal atrophic vaginitis: Secondary | ICD-10-CM

## 2020-02-12 DIAGNOSIS — R109 Unspecified abdominal pain: Secondary | ICD-10-CM

## 2020-02-12 DIAGNOSIS — R102 Pelvic and perineal pain: Secondary | ICD-10-CM

## 2020-02-12 DIAGNOSIS — K602 Anal fissure, unspecified: Secondary | ICD-10-CM

## 2020-02-12 NOTE — Progress Notes (Signed)
GYNECOLOGY  VISIT   HPI: 54 y.o.   Married White or Caucasian Not Hispanic or Latino  female   G0P0000 with Patient's last menstrual period was 11/21/2015.   here for lower right abdominal pain. Patient states that she urinated they other day and she had drops of blood from her rectum.  She has issues with intermittent hemorrhoidal flares. She has seen Dr Collene Mares, desires a different provider.  Approximately 2 weeks ago she started having bloating, reflux, constipation and hemorrhoid flare. She started on Psyllium husk, which is helping. She has 2 BM's a day, doesn't always feel empty. In the last 7-10 she is feeling better. No fevers, or chills. No urinary frequency, urgency or pain with urination.  About a week ago she was voiding and there was blood dripping from her rectum. She has previously only bleed with straining.  She c/o a one month h/o a constant. Aching pain, Ranges from a 2-6/10 in severity. Heat helps, no change with BM.  She also has lower right back and right hip pain as well, is very tight.  She feels like her urine stream is slow, doesn't always feel empty.  Menopausal, no bleeding. Not sexually active, too dry  Colonoscopy 4 years ago, had a polyp, told she wouldn't need another colonoscopy for 10 years.   GYNECOLOGIC HISTORY: Patient's last menstrual period was 11/21/2015. Contraception:none  Menopausal hormone therapy: none         OB History    Gravida  0   Para  0   Term  0   Preterm  0   AB  0   Living  0     SAB  0   TAB  0   Ectopic  0   Multiple  0   Live Births                 Patient Active Problem List   Diagnosis Date Noted  . Situational anxiety 04/02/2019  . Positive ANA (antinuclear antibody) 12/31/2018  . Right hip pain 02/23/2017  . Strain of adductor muscle, fascia and tendon of right thigh, initial encounter 09/01/2016  . History of endometriosis 07/19/2015  . Hydrosalpinx 07/19/2015  . Intramural leiomyoma of uterus  07/19/2015  . Cervical polyp 07/19/2015  . FOM (frequency of micturition) 05/08/2013  . Hematuria 05/08/2013  . UTI (urinary tract infection) 05/08/2013  . Acute urticaria 02/22/2012  . Paroxysmal supraventricular tachycardia ([place]) 06/04/2009  . Anemia 04/28/2009  . Palpitations 04/28/2009    Past Medical History:  Diagnosis Date  . Anxiety   . Dysrhythmia    wore Holter monitor and per pt dr said he was ok with results  . Family history of adverse reaction to anesthesia    paternal great uncle took 3 days to wake up, paternal cousin CPR during surgery  . GERD (gastroesophageal reflux disease)   . Hives 2013   Chronic     Past Surgical History:  Procedure Laterality Date  . COLONOSCOPY WITH PROPOFOL    . DILATATION & CURRETTAGE/HYSTEROSCOPY WITH RESECTOCOPE N/A 08/17/2015   Procedure: DILATATION & CURETTAGE/HYSTEROSCOPY;  Surgeon: Megan Salon, MD;  Location: [facility];  Service: Gynecology;  Laterality: N/A;  . LAPAROSCOPY  1992   due to endometriosis - Lupron x 6 months    Current Outpatient Medications  Medication Sig Dispense Refill  . Ascorbic Acid (VITAMIN C PO) Take by mouth.    Marland Kitchen b complex vitamins capsule     . Cholecalciferol (VITAMIN D3)  25 MCG (1000 UT) CAPS     . Multiple Vitamins-Minerals (ZINC PO) Take by mouth.    . Omega-3 Fatty Acids (FISH OIL PO) Take 1 capsule by mouth daily.     . Probiotic Product (PROBIOTIC PO) Take by mouth.    Marland Kitchen UNABLE TO FIND 2 (two) times daily. Butyrate with calcium & magnesium     No current facility-administered medications for this visit.     ALLERGIES: Cetirizine; Fexofenadine; Levofloxacin; Nitrofurantoin monohyd macro; Antihistamines, diphenhydramine-type; and Other  Family History  Problem Relation Age of Onset  . Hypertension Paternal Grandfather   . Heart attack Paternal Grandfather   . Depression Father   . Anxiety disorder Sister     Social History   Socioeconomic History  . Marital status: Married     Spouse name: Not on file  . Number of children: Not on file  . Years of education: Not on file  . Highest education level: Not on file  Occupational History  . Not on file  Tobacco Use  . Smoking status: Never Smoker  . Smokeless tobacco: Never Used  Substance and Sexual Activity  . Alcohol use: Not Currently  . Drug use: No  . Sexual activity: Not Currently    Partners: Male    Comment: post menopausal  Other Topics Concern  . Not on file  Social History Narrative  . Not on file   Social Determinants of Health   Financial Resource Strain:   . Difficulty of Paying Living Expenses:   Food Insecurity:   . Worried About Charity fundraiser in the Last Year:   . Arboriculturist in the Last Year:   Transportation Needs:   . Film/video editor (Medical):   Marland Kitchen Lack of Transportation (Non-Medical):   Physical Activity:   . Days of Exercise per Week:   . Minutes of Exercise per Session:   Stress:   . Feeling of Stress :   Social Connections:   . Frequency of Communication with Friends and Family:   . Frequency of Social Gatherings with Friends and Family:   . Attends Religious Services:   . Active Member of Clubs or Organizations:   . Attends Archivist Meetings:   Marland Kitchen Marital Status:   Intimate Partner Violence:   . Fear of Current or Ex-Partner:   . Emotionally Abused:   Marland Kitchen Physically Abused:   . Sexually Abused:     Review of Systems  Constitutional: Negative.   HENT: Negative.   Eyes: Negative.   Cardiovascular: Negative.   Gastrointestinal:       Bloating  Genitourinary: Negative.   Musculoskeletal: Negative.   Skin: Negative.   Neurological: Negative.   Endo/Heme/Allergies: Negative.   Psychiatric/Behavioral: Negative.     PHYSICAL EXAMINATION:    BP 102/62   Pulse 83   Temp 98.1 F (36.7 C)   Ht 5\' 1"  (1.549 m)   Wt 124 lb (56.2 kg)   LMP 11/21/2015   SpO2 95%   BMI 23.43 kg/m     General appearance: alert, cooperative and appears  stated age Abdomen: soft, non-tender; non distended, no masses,  no organomegaly. Examined with abdominal muscle tight and loose, not tender  Pelvic: External genitalia:  no lesions              Urethra:  normal appearing urethra with no masses, tenderness or lesions              Bartholins and Skenes:  normal                 Vagina: atrophic appearing vagina with normal color and discharge, no lesions              Cervix: no lesions              Bimanual Exam:  Uterus:  normal size, contour, position, consistency, mobility, non-tender              Adnexa: no mass, fullness, tenderness              Rectovaginal: deferred.              Anus: fissure at 6 o'clock, looks friable  Chaperone was present for exam.  ASSESSMENT Anal fissure RLQ abdominal/pelvic pain Vaginal atrophy Dyspareunia    PLAN Discussed treatment of anal fissure, she will continue the psyllium, hydrate well, discussed sitz baths. Handout given Return for pelvic ultrasound for pain Also discussed establishing care with Dr Carlean Purl We discussed vaginal estrogen, she will call if she wants to start it prior to her annual exam    An After Visit Summary was printed and given to the patient.  Over 20 minutes was spent in total patient care

## 2020-02-12 NOTE — Patient Instructions (Signed)
Anal Fissure, Adult  An anal fissure is a small tear or crack in the tissue of the anus. Bleeding from a fissure usually stops on its own within a few minutes. However, bleeding will often occur again with each bowel movement until the fissure heals. What are the causes? This condition is usually caused by passing a large or hard stool (feces). Other causes include:  Constipation.  Frequent diarrhea.  Inflammatory bowel disease (Crohn's disease or ulcerative colitis).  Childbirth.  Infections.  Anal sex. What are the signs or symptoms? Symptoms of this condition include:  Bleeding from the rectum.  Small amounts of blood seen on your stool, on the toilet paper, or in the toilet after a bowel movement. The blood coats the outside of the stool and is not mixed with the stool.  Painful bowel movements.  Itching or irritation around the anus. How is this diagnosed? A health care provider may diagnose this condition by closely examining the anal area. An anal fissure can usually be seen with careful inspection. In some cases, a rectal exam may be performed, or a short tube (anoscope) may be used to examine the anal canal. How is this treated? Initial treatment for this condition may include:  Taking steps to avoid constipation. This may include making changes to your diet, such as increasing your intake of fiber or fluid.  Taking fiber supplements. These supplements can soften your stool to help make bowel movements easier. Your health care provider may also prescribe a stool softener if your stool is hard.  Taking sitz baths. This may help to heal the tear.  Using medicated creams or ointments. These may be prescribed to lessen discomfort. Treatments that are sometimes used if initial treatments do not work well or if the condition is more severe may include:  Botulinum injection.  Surgery to repair the fissure. Follow these instructions at home: Eating and  drinking   Avoid foods that may cause constipation, such as bananas, milk, and other dairy products.  Eat all fruits, except bananas.  Drink enough fluid to keep your urine pale yellow.  Eat foods that are high in fiber, such as beans, whole grains, and fresh fruits and vegetables. General instructions   Take over-the-counter and prescription medicines only as told by your health care provider.  Use creams or ointments only as told by your health care provider.  Keep the anal area clean and dry.  Take sitz baths as told by your health care provider. Do not use soap in the sitz baths.  Keep all follow-up visits as told by your health care provider. This is important. Contact a health care provider if you have:  More bleeding.  A fever.  Diarrhea that is mixed with blood.  Pain that continues.  Ongoing problems that are getting worse rather than better. Summary  An anal fissure is a small tear or crack in the tissue of the anus. This condition is usually caused by passing a large or hard stool (feces). Other causes include constipation and frequent diarrhea.  Initial treatment for this condition may include taking steps to avoid constipation, such as increasing your intake of fiber or fluid.  Follow instructions for care as told by your health care provider.  Contact your health care provider if you have more bleeding or your problem is getting worse rather than better.  Keep all follow-up visits as told by your health care provider. This is important. This information is not intended to replace advice given   to you by your health care provider. Make sure you discuss any questions you have with your health care provider. Document Revised: 04/26/2018 Document Reviewed: 04/26/2018 Elsevier Patient Education  2020 Elsevier Inc.  

## 2020-02-17 ENCOUNTER — Encounter: Payer: Self-pay | Admitting: Certified Nurse Midwife

## 2020-02-26 ENCOUNTER — Other Ambulatory Visit: Payer: Self-pay

## 2020-02-26 DIAGNOSIS — R102 Pelvic and perineal pain: Secondary | ICD-10-CM

## 2020-03-06 ENCOUNTER — Telehealth: Payer: Self-pay | Admitting: *Deleted

## 2020-03-06 ENCOUNTER — Other Ambulatory Visit: Payer: Self-pay

## 2020-03-06 ENCOUNTER — Telehealth: Payer: Self-pay | Admitting: Obstetrics and Gynecology

## 2020-03-06 ENCOUNTER — Ambulatory Visit: Payer: 59 | Admitting: Obstetrics & Gynecology

## 2020-03-06 ENCOUNTER — Encounter: Payer: Self-pay | Admitting: Obstetrics & Gynecology

## 2020-03-06 VITALS — BP 108/66 | HR 100 | Temp 98.8°F | Ht 62.5 in | Wt 122.6 lb

## 2020-03-06 DIAGNOSIS — N8110 Cystocele, unspecified: Secondary | ICD-10-CM | POA: Diagnosis not present

## 2020-03-06 DIAGNOSIS — N898 Other specified noninflammatory disorders of vagina: Secondary | ICD-10-CM | POA: Diagnosis not present

## 2020-03-06 DIAGNOSIS — R140 Abdominal distension (gaseous): Secondary | ICD-10-CM

## 2020-03-06 NOTE — Telephone Encounter (Signed)
-----   Message from Megan Salon, MD sent at 03/06/2020  1:02 PM EDT ----- Regarding: PUS Pt is scheduled for PUS 03/31/2020.  She's like to move this up as soon as possible.  IWhatever appt is sooner is what she desires.  Could you adjust appt and call her please?  Thanks.  Vinnie Level

## 2020-03-06 NOTE — Telephone Encounter (Signed)
Spoke with patient. PUS rescheduled to 03/17/20 at 1pm, consult to follow at 1:30pm with Dr. Talbert Nan. Patient declined appt on 4/15. Patient verbalizes understanding and is agreeable.   Routing to provider for final review. Patient is agreeable to disposition. Will close encounter.  Cc: Dr. Talbert Nan, [facility] [place], [place]

## 2020-03-06 NOTE — Progress Notes (Signed)
GYNECOLOGY  VISIT  CC:   Possible vaginal mass  HPI: 54 y.o. G0P0000 Married White or Caucasian female here for vaginal lump that she noted a few days ago.  She has been using a new vaginal moisturizer and felt this when she placed the herbal moisturizer.  She has not had any vaginal bleeding.  Does feel like she has some rectal pressure when she touches it.  Reports she's never felt as deep in her vagina as she did when applying the cream.  Was diagnosed with fissure 02/26/2020 when she saw Dr. Talbert Nan.  She has been using OTC products.  Had minimal bleeding with bowel movement earlier this week.  Is much improved.    Has also been experiencing some abdominal bloating with some mild RLQ pain.  Not sure this is related.  Has PUS scheduled 04/01/2019.  Would like to move up appt.  Last colonoscopy was 2017.  She does have polyps.    GYNECOLOGIC HISTORY: Patient's last menstrual period was 11/21/2015. Contraception: postmenopausal Menopausal hormone therapy: none  Patient Active Problem List   Diagnosis Date Noted  . Situational anxiety 04/02/2019  . Positive ANA (antinuclear antibody) 12/31/2018  . Right hip pain 02/23/2017  . Strain of adductor muscle, fascia and tendon of right thigh, initial encounter 09/01/2016  . History of endometriosis 07/19/2015  . Hydrosalpinx 07/19/2015  . Intramural leiomyoma of uterus 07/19/2015  . Cervical polyp 07/19/2015  . FOM (frequency of micturition) 05/08/2013  . Hematuria 05/08/2013  . UTI (urinary tract infection) 05/08/2013  . Acute urticaria 02/22/2012  . Paroxysmal supraventricular tachycardia ([place]) 06/04/2009  . Anemia 04/28/2009  . Palpitations 04/28/2009    Past Medical History:  Diagnosis Date  . Anxiety   . Dysrhythmia    wore Holter monitor and per pt dr said he was ok with results  . Family history of adverse reaction to anesthesia    paternal great uncle took 3 days to wake up, paternal cousin CPR during surgery  . GERD  (gastroesophageal reflux disease)   . Hives 2013   Chronic     Past Surgical History:  Procedure Laterality Date  . COLONOSCOPY WITH PROPOFOL    . DILATATION & CURRETTAGE/HYSTEROSCOPY WITH RESECTOCOPE N/A 08/17/2015   Procedure: DILATATION & CURETTAGE/HYSTEROSCOPY;  Surgeon: Megan Salon, MD;  Location: [facility];  Service: Gynecology;  Laterality: N/A;  . LAPAROSCOPY  1992   due to endometriosis - Lupron x 6 months    MEDS:   Current Outpatient Medications on File Prior to Visit  Medication Sig Dispense Refill  . Ascorbic Acid (VITAMIN C PO) Take by mouth.    Marland Kitchen b complex vitamins capsule     . Cholecalciferol (VITAMIN D3) 25 MCG (1000 UT) CAPS     . Multiple Vitamins-Minerals (ZINC PO) Take by mouth.    . Omega-3 Fatty Acids (FISH OIL PO) Take 1 capsule by mouth daily.     . Probiotic Product (PROBIOTIC PO) Take by mouth.    Marland Kitchen UNABLE TO FIND 2 (two) times daily. Butyrate with calcium & magnesium     No current facility-administered medications on file prior to visit.    ALLERGIES: Cetirizine; Fexofenadine; Levofloxacin; Nitrofurantoin monohyd macro; Antihistamines, diphenhydramine-type; and Other  Family History  Problem Relation Age of Onset  . Hypertension Paternal Grandfather   . Heart attack Paternal Grandfather   . Depression Father   . Anxiety disorder Sister     SH:  Married, non smoker  Review of Systems  Constitutional: Negative.  HENT: Negative.   Eyes: Negative.   Respiratory: Negative.   Cardiovascular: Negative.   Gastrointestinal: Negative.   Endocrine: Negative.   Genitourinary:       Vaginal lump  Musculoskeletal: Negative.   Skin: Negative.   Allergic/Immunologic: Negative.   Neurological: Negative.   Hematological: Negative.   Psychiatric/Behavioral: Negative.     PHYSICAL EXAMINATION:    BP 108/66 (BP Location: Left Arm, Patient Position: Sitting, Cuff Size: Normal)   Pulse 100   Temp 98.8 F (37.1 C)   Ht 5' 2.5" (1.588 m)   Wt 122  lb 9.6 oz (55.6 kg)   LMP 11/21/2015   BMI 22.07 kg/m     General appearance: alert, cooperative and appears stated age NLymph:  no inguinal LAD noted  Pelvic: External genitalia:  no lesions              Urethra:  normal appearing urethra with no masses, tenderness or lesions              Bartholins and Skenes: normal                 Vagina: normal appearing vagina with normal color and discharge, no lesions, second degree cystocele              Cervix: no lesions              Bimanual Exam:  Uterus:  normal size, contour, position, consistency, mobility, non-tender              Adnexa: no mass, fullness, tenderness              Anus:  normal sphincter tone, no lesions, no visible fissure today  Chaperone, Lendon Collar, CMA, was present for exam.  Assessment: Vaginal "mass" that is pt's cervix.  There is no other abnormality present on exam except a cystocele which is also mild Anal fissure that is improved visually when compared to prior note and with symptoms  Plan: She is going to return for PUS.  She would like to move up appt is possible.  Will get this rescheduled for her.     20 minutes total spent with pt including discussion of anatomy and reassuring pt about concerns.

## 2020-03-06 NOTE — Telephone Encounter (Signed)
Patient is calling regarding something something that she felt inside of her vaginal canal. Patient described it as the size and shape of a walnut. Patient stated that it is hard. Patient is calling to move up ultrasound appointment, but I told her to expect a call back from a triage nurse regarding coming in sooner.

## 2020-03-06 NOTE — Telephone Encounter (Signed)
Spoke with pt. Last OV 02/12/2020 with JJ for abd pain/rectal bleeding. Dx with anal fissure. Has PUS scheduled 03/31/2020 for abd pain evaluation.   Pt states using herbal supplement for vaginal moisture when noticed feeling " vaginal lump" that is hard and size of walnut. Pt can only feel while standing. Denies pain or vaginal  bleeding. Pt is very concerned and wanting to be seen for evaluation today. Pt scheduled as work-in appt with Dr Sabra Heck at 1230 pm. Pt agreeable and verbalized understanding of date and time of appt. CPS Neg.   Routing to Dr Sabra Heck for review.  Encounter closed.

## 2020-03-16 ENCOUNTER — Other Ambulatory Visit: Payer: Self-pay

## 2020-03-17 ENCOUNTER — Ambulatory Visit: Payer: 59 | Admitting: Obstetrics and Gynecology

## 2020-03-17 ENCOUNTER — Encounter: Payer: Self-pay | Admitting: Obstetrics and Gynecology

## 2020-03-17 ENCOUNTER — Ambulatory Visit (INDEPENDENT_AMBULATORY_CARE_PROVIDER_SITE_OTHER): Payer: 59

## 2020-03-17 VITALS — BP 118/64 | HR 71 | Temp 97.7°F | Ht 61.0 in | Wt 122.0 lb

## 2020-03-17 DIAGNOSIS — Z8719 Personal history of other diseases of the digestive system: Secondary | ICD-10-CM

## 2020-03-17 DIAGNOSIS — N941 Unspecified dyspareunia: Secondary | ICD-10-CM | POA: Diagnosis not present

## 2020-03-17 DIAGNOSIS — R102 Pelvic and perineal pain: Secondary | ICD-10-CM

## 2020-03-17 DIAGNOSIS — R109 Unspecified abdominal pain: Secondary | ICD-10-CM | POA: Diagnosis not present

## 2020-03-17 DIAGNOSIS — N952 Postmenopausal atrophic vaginitis: Secondary | ICD-10-CM

## 2020-03-17 NOTE — Progress Notes (Signed)
GYNECOLOGY  VISIT   HPI: 54 y.o.   Married White or Caucasian Not Hispanic or Latino  female   G0P0000 with Patient's last menstrual period was 11/21/2015.   here for evaluation of abdominal pelvic pain. She was seen last month c/o RLQ abdominal/pelvic pain and bloating. Here for an ultrasound.    She was having issues with an anal fissure at the time of her visit last month and was started on Psyllium. She was then seen a few weeks ago c/o a vaginal lump and was noted to have a mild second degree cystocele. She has been using a vaginal moisturizer, helping. Not aware of the cystocele, she is comfortable.   Hasn't been sexually active for 2 years secondary to dryness. She would like to be active again. Hoping the vaginal moisturizer will help.  She started having entry dyspareunia when she started perimenopause. She was also getting frequent UTI's with intercourse in the past.   She c/o a 3 month h/o intermittent pain in the RLQ/pelvis/hip region. No increase in pain with movement. It can be worse with pressure (TV ultrasound was uncomfortable on the right). Getting the pain 2-3 x a week, last for a couple of hours to 15 hours. She is trying to stretch and thinks it's helping some. BM are softer with psyllium husk. 1-3 BM's q am. Feels she is emptying her bowels. Fissure has healed.  It has improved since her last visit.   GYNECOLOGIC HISTORY: Patient's last menstrual period was 11/21/2015. Contraception:PMP Menopausal hormone therapy: none        OB History    Gravida  0   Para  0   Term  0   Preterm  0   AB  0   Living  0     SAB  0   TAB  0   Ectopic  0   Multiple  0   Live Births                 Patient Active Problem List   Diagnosis Date Noted  . Situational anxiety 04/02/2019  . Positive ANA (antinuclear antibody) 12/31/2018  . Right hip pain 02/23/2017  . Strain of adductor muscle, fascia and tendon of right thigh, initial encounter 09/01/2016  .  History of endometriosis 07/19/2015  . Hydrosalpinx 07/19/2015  . Intramural leiomyoma of uterus 07/19/2015  . Cervical polyp 07/19/2015  . FOM (frequency of micturition) 05/08/2013  . Hematuria 05/08/2013  . UTI (urinary tract infection) 05/08/2013  . Acute urticaria 02/22/2012  . Paroxysmal supraventricular tachycardia ([place]) 06/04/2009  . Anemia 04/28/2009  . Palpitations 04/28/2009    Past Medical History:  Diagnosis Date  . Anxiety   . Dysrhythmia    wore Holter monitor and per pt dr said he was ok with results  . Family history of adverse reaction to anesthesia    paternal great uncle took 3 days to wake up, paternal cousin CPR during surgery  . GERD (gastroesophageal reflux disease)   . Hives 2013   Chronic     Past Surgical History:  Procedure Laterality Date  . COLONOSCOPY WITH PROPOFOL    . DILATATION & CURRETTAGE/HYSTEROSCOPY WITH RESECTOCOPE N/A 08/17/2015   Procedure: DILATATION & CURETTAGE/HYSTEROSCOPY;  Surgeon: Megan Salon, MD;  Location: [facility];  Service: Gynecology;  Laterality: N/A;  . LAPAROSCOPY  1992   due to endometriosis - Lupron x 6 months    Current Outpatient Medications  Medication Sig Dispense Refill  . Ascorbic Acid (VITAMIN  C PO) Take by mouth.    Marland Kitchen b complex vitamins capsule     . Cholecalciferol (VITAMIN D3) 25 MCG (1000 UT) CAPS     . Multiple Vitamins-Minerals (ZINC PO) Take by mouth.    . Omega-3 Fatty Acids (FISH OIL PO) Take 1 capsule by mouth daily.     . Probiotic Product (PROBIOTIC PO) Take by mouth.    Marland Kitchen UNABLE TO FIND 2 (two) times daily. Butyrate with calcium & magnesium     No current facility-administered medications for this visit.     ALLERGIES: Cetirizine; Fexofenadine; Levofloxacin; Nitrofurantoin monohyd macro; Antihistamines, diphenhydramine-type; and Other  Family History  Problem Relation Age of Onset  . Hypertension Paternal Grandfather   . Heart attack Paternal Grandfather   . Depression Father   . Anxiety  disorder Sister     Social History   Socioeconomic History  . Marital status: Married    Spouse name: Not on file  . Number of children: Not on file  . Years of education: Not on file  . Highest education level: Not on file  Occupational History  . Not on file  Tobacco Use  . Smoking status: Never Smoker  . Smokeless tobacco: Never Used  Substance and Sexual Activity  . Alcohol use: Not Currently  . Drug use: No  . Sexual activity: Not Currently    Partners: Male    Comment: post menopausal  Other Topics Concern  . Not on file  Social History Narrative  . Not on file   Social Determinants of Health   Financial Resource Strain:   . Difficulty of Paying Living Expenses:   Food Insecurity:   . Worried About Charity fundraiser in the Last Year:   . Arboriculturist in the Last Year:   Transportation Needs:   . Film/video editor (Medical):   Marland Kitchen Lack of Transportation (Non-Medical):   Physical Activity:   . Days of Exercise per Week:   . Minutes of Exercise per Session:   Stress:   . Feeling of Stress :   Social Connections:   . Frequency of Communication with Friends and Family:   . Frequency of Social Gatherings with Friends and Family:   . Attends Religious Services:   . Active Member of Clubs or Organizations:   . Attends Archivist Meetings:   Marland Kitchen Marital Status:   Intimate Partner Violence:   . Fear of Current or Ex-Partner:   . Emotionally Abused:   Marland Kitchen Physically Abused:   . Sexually Abused:     Review of Systems  All other systems reviewed and are negative.   PHYSICAL EXAMINATION:    LMP 11/21/2015     General appearance: alert, cooperative and appears stated age Abdomen: soft, mildly tender in the region of the ascending colon, no rebound, no guarding; non distended, no masses,  no organomegaly. Not tender when tensing her abdominal muscles.  Ultrasound images reviewed with the patient. Small stable left hydrosalpinx, other wise normal  ultrasound.    ASSESSMENT Abdominal/pelvic pain, normal ultrasound. Given location of her pain and exam today suspect GI etiology. She has noticed some improvement with the use of psyllium Anal fissure has healed H/O vaginal atrophy and dyspareunia. She hasn't been sexually active in 2 years. Would like to be    PLAN Patient reassured normal gyn ultrasound She doesn't eat gluten, recommended she try cutting out dairy for a couple of weeks to see if it helps her pain  Recommended she calendar her pain, BM and diet Continue with Psyllium We discussed the option of vaginal estrogen. She will attempt to be sexually active again. Recommended she use lubrication and that she control the rate and depth of penetration. If she is still having trouble, recommend she try vaginal estrogen. Also discussed vaginal dilators as an option (work better with vaginal estrogen) F/u this summer for annual exam, sooner with concerns.    In addition to reviewing the normal GYN ultrasound with the patient, ~ 15-20 minutes was spent in total patient care.

## 2020-03-31 ENCOUNTER — Other Ambulatory Visit: Payer: Self-pay

## 2020-03-31 ENCOUNTER — Other Ambulatory Visit: Payer: Self-pay | Admitting: Obstetrics and Gynecology

## 2020-06-03 ENCOUNTER — Ambulatory Visit: Payer: Managed Care, Other (non HMO) | Admitting: Certified Nurse Midwife

## 2020-06-08 NOTE — Progress Notes (Signed)
54 y.o. G0P0000 Married White or Caucasian Not Hispanic or Latino female here for annual exam.  Patient is still having vaginal dryness even with the vaginal moisturizer. Not able to be sexually active secondary to pain. Would like to be sexually active again. Used to get bladder infections with intercourse, started in her early 27's. Doesn't tolerate antibiotics, even 50 mg of macrodantin.   H/O grade 2 cystocele, symptoms helped with vaginal moisturizer. Urinary stream is sluggish sometimes, mostly okay. Doesn't notice the cystocele.   She has been having pain in her left lateral breast since her covid vaccination in April.  She was seen in the spring with abdominal/pelvic/back pain. Normal GYN ultrasound (small stable left hydrosalpinx). She continues to have constant low level pain, sometimes worse. She can mostly ignore it, overall tolerable.    Patient's last menstrual period was 11/21/2015.          Sexually active: No.  The current method of family planning is post menopausal status.    Exercising: Yes.    Walking 3-4 days a week tennis and yoga Smoker:  no  Health Maintenance: Pap:  06/03/19 WNL HPV Neg  History of abnormal Pap:  no MMG:  08/28/19 density C Bi-rads 1 neg  BMD:   None  Colonoscopy: 2017 polyps f/u 5 years  TDaP:  10/11/2010 Gardasil: NA   reports that she has never smoked. She has never used smokeless tobacco. She reports previous alcohol use. She reports that she does not use drugs. 1-2 drinks a week. She works for a non-profit part time.   Past Medical History:  Diagnosis Date  . Anxiety   . Dysrhythmia    wore Holter monitor and per pt dr said he was ok with results  . Family history of adverse reaction to anesthesia    paternal great uncle took 3 days to wake up, paternal cousin CPR during surgery  . GERD (gastroesophageal reflux disease)   . Hives 2013   Chronic     Past Surgical History:  Procedure Laterality Date  . COLONOSCOPY WITH PROPOFOL    .  DILATATION & CURRETTAGE/HYSTEROSCOPY WITH RESECTOCOPE N/A 08/17/2015   Procedure: DILATATION & CURETTAGE/HYSTEROSCOPY;  Surgeon: Megan Salon, MD;  Location: [facility];  Service: Gynecology;  Laterality: N/A;  . LAPAROSCOPY  1992   due to endometriosis - Lupron x 6 months    Current Outpatient Medications  Medication Sig Dispense Refill  . Ascorbic Acid (VITAMIN C PO) Take by mouth.    Marland Kitchen b complex vitamins capsule     . Cholecalciferol (VITAMIN D3) 25 MCG (1000 UT) CAPS     . Multiple Vitamins-Minerals (ZINC PO) Take by mouth.    . Omega-3 Fatty Acids (FISH OIL PO) Take 1 capsule by mouth daily.     . Probiotic Product (PROBIOTIC PO) Take by mouth.    Marland Kitchen UNABLE TO FIND 2 (two) times daily. Butyrate with calcium & magnesium     No current facility-administered medications for this visit.    Family History  Problem Relation Age of Onset  . Hypertension Paternal Grandfather   . Heart attack Paternal Grandfather   . Depression Father   . Anxiety disorder Sister     Review of Systems  Genitourinary: Positive for vaginal pain.       Vaginal dryness  All other systems reviewed and are negative.   Exam:   LMP 11/21/2015   Weight change: @WEIGHTCHANGE @ Height:      Ht Readings from Last 3  Encounters:  03/17/20 5\' 1"  (1.549 m)  03/06/20 5' 2.5" (1.588 m)  02/12/20 5\' 1"  (1.549 m)    General appearance: alert, cooperative and appears stated age Head: Normocephalic, without obvious abnormality, atraumatic Neck: no adenopathy, supple, symmetrical, trachea midline and thyroid normal to inspection and palpation Lungs: clear to auscultation bilaterally Cardiovascular: regular rate and rhythm Breasts: in the left breast at 2 o'clock, ~ 1 cm from the areolar region is a tender, lima bean shaped lump. No other lumps, no skin changes.  Abdomen: soft, non-tender; non distended,  no masses,  no organomegaly Extremities: extremities normal, atraumatic, no cyanosis or edema Skin: Skin color,  texture, turgor normal. No rashes or lesions Lymph nodes: Cervical, supraclavicular, and axillary nodes normal. No abnormal inguinal nodes palpated Neurologic: Grossly normal   Pelvic: External genitalia:  no lesions              Urethra:  normal appearing urethra with no masses, tenderness or lesions              Bartholins and Skenes: normal                 Vagina: very atrophic appearing vagina with normal color and discharge, no lesions. No cystocele noted today, only examined supine (with and without valsalva)              Cervix: no lesions               Bimanual Exam:  Uterus:  normal size, contour, position, consistency, mobility, non-tender              Adnexa: no mass, fullness, tenderness               Rectovaginal: Confirms               Anus:  normal sphincter tone, no lesions  Gae Dry chaperoned for the exam.  A:  Well Woman with normal exam  Tender left breast lump  Vaginal atrophy, unable to be sexually active  H/o cystocele, not noted on exam today  H/O vit d def  H/O small thyroid nodules  P:   Diagnostic breast imaging  If imaging is negative, will start vaginal estrogen  We discussed that she may need lidocaine ointment and my need vaginal dilators  No pap this year  Colonoscopy next year  Discussed breast self exam  Discussed calcium and vit D intake  Screening labs, vit d, tsh

## 2020-06-10 ENCOUNTER — Telehealth: Payer: Self-pay

## 2020-06-10 ENCOUNTER — Encounter: Payer: Self-pay | Admitting: Obstetrics and Gynecology

## 2020-06-10 ENCOUNTER — Ambulatory Visit: Payer: 59 | Admitting: Obstetrics and Gynecology

## 2020-06-10 ENCOUNTER — Other Ambulatory Visit: Payer: Self-pay | Admitting: Obstetrics and Gynecology

## 2020-06-10 ENCOUNTER — Other Ambulatory Visit: Payer: Self-pay

## 2020-06-10 VITALS — BP 100/62 | Ht 61.0 in | Wt 133.0 lb

## 2020-06-10 DIAGNOSIS — E559 Vitamin D deficiency, unspecified: Secondary | ICD-10-CM

## 2020-06-10 DIAGNOSIS — N952 Postmenopausal atrophic vaginitis: Secondary | ICD-10-CM | POA: Diagnosis not present

## 2020-06-10 DIAGNOSIS — Z8639 Personal history of other endocrine, nutritional and metabolic disease: Secondary | ICD-10-CM

## 2020-06-10 DIAGNOSIS — Z01419 Encounter for gynecological examination (general) (routine) without abnormal findings: Secondary | ICD-10-CM | POA: Diagnosis not present

## 2020-06-10 DIAGNOSIS — N6320 Unspecified lump in the left breast, unspecified quadrant: Secondary | ICD-10-CM

## 2020-06-10 DIAGNOSIS — Z0000 Encounter for general adult medical examination without abnormal findings: Secondary | ICD-10-CM | POA: Diagnosis not present

## 2020-06-10 DIAGNOSIS — N644 Mastodynia: Secondary | ICD-10-CM

## 2020-06-10 NOTE — Telephone Encounter (Signed)
Call placed to [facility] and spoke with [facility]. Pt scheduled for left breast dx MMG and [place] for 06/25/20 at 840 am.   Orders placed by [facility] for co-sign   Call placed to pt. Left detailed message, ok per DPR to inform pt with scheduled breast imaging. Pt to return call with any questions or concerns.   Routing to Dr Talbert Nan. Encounter closed.  Cc: Sharee Pimple, RN for MMG hold

## 2020-06-10 NOTE — Patient Instructions (Signed)

## 2020-06-10 NOTE — Telephone Encounter (Signed)
-----   Message from Salvadore Dom, MD sent at 06/10/2020  2:35 PM EDT ----- Please set up diagnostic breast imaging, attention left breast at 2 o'clock. Lump and pain. Thanks, Sharee Pimple

## 2020-06-11 LAB — LIPID PANEL
Chol/HDL Ratio: 2.2 ratio (ref 0.0–4.4)
Cholesterol, Total: 237 mg/dL — ABNORMAL HIGH (ref 100–199)
HDL: 108 mg/dL (ref >39)
LDL Chol Calc (NIH): 116 mg/dL — ABNORMAL HIGH (ref 0–99)
Triglycerides: 75 mg/dL (ref 0–149)
VLDL Cholesterol Cal: 13 mg/dL (ref 5–40)

## 2020-06-11 LAB — COMPREHENSIVE METABOLIC PANEL
ALT: 14 IU/L (ref 0–32)
AST: 22 IU/L (ref 0–40)
Albumin/Globulin Ratio: 2.3 — ABNORMAL HIGH (ref 1.2–2.2)
Albumin: 4.5 g/dL (ref 3.8–4.9)
Alkaline Phosphatase: 89 IU/L (ref 48–121)
BUN/Creatinine Ratio: 19 (ref 9–23)
BUN: 17 mg/dL (ref 6–24)
Bilirubin Total: 0.5 mg/dL (ref 0.0–1.2)
CO2: 24 mmol/L (ref 20–29)
Calcium: 9.9 mg/dL (ref 8.7–10.2)
Chloride: 102 mmol/L (ref 96–106)
Creatinine, Ser: 0.9 mg/dL (ref 0.57–1.00)
GFR calc Af Amer: 84 mL/min/{1.73_m2} (ref >59)
GFR calc non Af Amer: 73 mL/min/{1.73_m2} (ref >59)
Globulin, Total: 2 g/dL (ref 1.5–4.5)
Glucose: 79 mg/dL (ref 65–99)
Potassium: 5 mmol/L (ref 3.5–5.2)
Sodium: 140 mmol/L (ref 134–144)
Total Protein: 6.5 g/dL (ref 6.0–8.5)

## 2020-06-11 LAB — CBC
Hematocrit: 44.1 % (ref 34.0–46.6)
Hemoglobin: 14.6 g/dL (ref 11.1–15.9)
MCH: 31 pg (ref 26.6–33.0)
MCHC: 33.1 g/dL (ref 31.5–35.7)
MCV: 94 fL (ref 79–97)
Platelets: 170 10*3/uL (ref 150–450)
RBC: 4.71 x10E6/uL (ref 3.77–5.28)
RDW: 12.2 % (ref 11.7–15.4)
WBC: 8.3 10*3/uL (ref 3.4–10.8)

## 2020-06-11 LAB — TSH: TSH: 0.929 u[IU]/mL (ref 0.450–4.500)

## 2020-06-11 LAB — VITAMIN D 25 HYDROXY (VIT D DEFICIENCY, FRACTURES): Vit D, 25-Hydroxy: 31.3 ng/mL (ref 30.0–100.0)

## 2020-06-11 NOTE — Telephone Encounter (Signed)
Placed in MMG hold.  

## 2020-06-12 ENCOUNTER — Telehealth: Payer: Self-pay

## 2020-06-12 ENCOUNTER — Encounter: Payer: Self-pay | Admitting: Obstetrics and Gynecology

## 2020-06-12 NOTE — Telephone Encounter (Signed)
[place] Getting [facility] Clinical Pool Hi Dr. Talbert Nan,  Thanks for your note on my labs. Since I'm currently taking 1000 IUs of vitamin D3 daily and I'm out in direct sun quite a bit, should I up it to 2000 IUs of D3 supplementation a day since my result was in the low normal range?   Thanks so much! I hope you enjoy your summer too.  Laurie Parsons

## 2020-06-12 NOTE — Telephone Encounter (Signed)
06/10/20 Vit D 31.3  Hi Laurie Parsons, Your LDL (bad cholesterol) is mildly elevated, but the rest of your lipid panel is very good. Your vit d is just in the normal range, I would recommend that you take a daily vit d supplement, 400-800 IU every day (long term). The rest of your lab work is normal. Have a great summer! [place] Boast  Written by Salvadore Dom, MD on 06/12/2020 11:09 AM EDT Seen by patient Laurie Parsons on 06/12/2020 12:01 PM   Dr. Talbert Nan -patient is already taking 1,000 IU daily, she is asking if she should increase to 2,000 IU daily?

## 2020-06-15 NOTE — Telephone Encounter (Signed)
Spoke with patient, advised per Dr. Silva. Patient verbalizes understanding and is agreeable.  Encounter closed.  

## 2020-06-15 NOTE — Telephone Encounter (Signed)
This is Dr. Quincy Simmonds reviewing Dr. Gentry Fitz calls while she is out of the office.  [place] for the patient to increased to vit D 2000 IU daily.

## 2020-06-25 ENCOUNTER — Ambulatory Visit
Admission: RE | Admit: 2020-06-25 | Discharge: 2020-06-25 | Disposition: A | Discharge status: Home / Self Care | Payer: 59 | Source: Ambulatory Visit | Attending: Obstetrics and Gynecology | Admitting: Obstetrics and Gynecology

## 2020-06-25 ENCOUNTER — Other Ambulatory Visit: Payer: Self-pay

## 2020-06-25 DIAGNOSIS — N6320 Unspecified lump in the left breast, unspecified quadrant: Secondary | ICD-10-CM

## 2020-06-26 ENCOUNTER — Telehealth: Payer: Self-pay | Admitting: *Deleted

## 2020-06-26 NOTE — Telephone Encounter (Signed)
Burnice Logan, RN  06/26/2020 8:09 AM EDT Back to Top    Left message to call Sharee Pimple, RN at [facility].   Removed from MMG hold.

## 2020-06-26 NOTE — Telephone Encounter (Signed)
-----   Message from Salvadore Dom, MD sent at 06/25/2020  2:38 PM EDT ----- Take out of mammogram hold. Please schedule her for a repeat breast exam in 10/21

## 2020-07-02 NOTE — Telephone Encounter (Signed)
Left message to call Dehaven Sine, RN at [facility] [phone].   

## 2020-07-02 NOTE — Telephone Encounter (Signed)
Spoke with patient. Advised per Dr. Talbert Nan. Patient reports she plans to schedule her screening MMG in 07/2020. Per review of Epic, last screening MMG 08/28/19.  OV scheduled for 08/27/20 at 4:30pm with Dr. Talbert Nan, patient will plan to schedule screening for a date after her OV. Patient verbalizes understanding and is agreeable.   Encounter closed.

## 2020-08-27 ENCOUNTER — Ambulatory Visit: Payer: 59 | Admitting: Obstetrics and Gynecology

## 2020-08-27 ENCOUNTER — Other Ambulatory Visit: Payer: Self-pay

## 2020-08-27 ENCOUNTER — Ambulatory Visit (INDEPENDENT_AMBULATORY_CARE_PROVIDER_SITE_OTHER): Payer: 59 | Admitting: Obstetrics and Gynecology

## 2020-08-27 ENCOUNTER — Encounter: Payer: Self-pay | Admitting: Obstetrics and Gynecology

## 2020-08-27 VITALS — BP 110/62 | HR 83 | Ht 61.0 in | Wt 135.6 lb

## 2020-08-27 DIAGNOSIS — N644 Mastodynia: Secondary | ICD-10-CM

## 2020-08-27 DIAGNOSIS — Z23 Encounter for immunization: Secondary | ICD-10-CM | POA: Diagnosis not present

## 2020-08-27 NOTE — Patient Instructions (Signed)

## 2020-08-27 NOTE — Progress Notes (Signed)
GYNECOLOGY  VISIT   HPI: 54 y.o.   Married White or Caucasian Not Hispanic or Latino  female   G0P0000 with Patient's last menstrual period was 11/21/2015.   here for breast follow up.  At the time of her annual exam in 7/14, she was c/o left breast pain and was noted to have a tender, lima bean shaped lump left breast lump at 2 o'clock, ~ 1 cm from the areolar region. Negative breast imaging.  She still has some tenderness, but less. Only notices it if she touches it.  She doesn't feel a lump. Desires TDAP today.  We discussed vaginal estrogen at her annual, she will let me know if she wants to try it.   GYNECOLOGIC HISTORY: Patient's last menstrual period was 11/21/2015. Contraception: none  Menopausal hormone therapy: none         OB History    Gravida  0   Para  0   Term  0   Preterm  0   AB  0   Living  0     SAB  0   TAB  0   Ectopic  0   Multiple  0   Live Births                 Patient Active Problem List   Diagnosis Date Noted  . Situational anxiety 04/02/2019  . Positive ANA (antinuclear antibody) 12/31/2018  . Right hip pain 02/23/2017  . Strain of adductor muscle, fascia and tendon of right thigh, initial encounter 09/01/2016  . History of endometriosis 07/19/2015  . Hydrosalpinx 07/19/2015  . Intramural leiomyoma of uterus 07/19/2015  . Cervical polyp 07/19/2015  . FOM (frequency of micturition) 05/08/2013  . Hematuria 05/08/2013  . UTI (urinary tract infection) 05/08/2013  . Acute urticaria 02/22/2012  . Paroxysmal supraventricular tachycardia ([place]) 06/04/2009  . Anemia 04/28/2009  . Palpitations 04/28/2009    Past Medical History:  Diagnosis Date  . Anxiety   . Dysrhythmia    wore Holter monitor and per pt dr said he was ok with results  . Family history of adverse reaction to anesthesia    paternal great uncle took 3 days to wake up, paternal cousin CPR during surgery  . GERD (gastroesophageal reflux disease)   . Hives 2013    Chronic     Past Surgical History:  Procedure Laterality Date  . COLONOSCOPY WITH PROPOFOL    . DILATATION & CURRETTAGE/HYSTEROSCOPY WITH RESECTOCOPE N/A 08/17/2015   Procedure: DILATATION & CURETTAGE/HYSTEROSCOPY;  Surgeon: Megan Salon, MD;  Location: [facility];  Service: Gynecology;  Laterality: N/A;  . LAPAROSCOPY  1992   due to endometriosis - Lupron x 6 months    Current Outpatient Medications  Medication Sig Dispense Refill  . Ascorbic Acid (VITAMIN C PO) Take by mouth.    Marland Kitchen b complex vitamins capsule     . Cholecalciferol (VITAMIN D3) 25 MCG (1000 UT) CAPS     . Multiple Vitamins-Minerals (ZINC PO) Take by mouth.    . Omega-3 Fatty Acids (FISH OIL PO) Take 1 capsule by mouth daily.     . Probiotic Product (PROBIOTIC PO) Take by mouth.     No current facility-administered medications for this visit.     ALLERGIES: Cetirizine; Fexofenadine; Levofloxacin; Nitrofurantoin monohyd macro; Antihistamines, diphenhydramine-type; and Other  Family History  Problem Relation Age of Onset  . Hypertension Paternal Grandfather   . Heart attack Paternal Grandfather   . Depression Father   . Anxiety  disorder Sister     Social History   Socioeconomic History  . Marital status: Married    Spouse name: Not on file  . Number of children: Not on file  . Years of education: Not on file  . Highest education level: Not on file  Occupational History  . Not on file  Tobacco Use  . Smoking status: Never Smoker  . Smokeless tobacco: Never Used  Vaping Use  . Vaping Use: Never used  Substance and Sexual Activity  . Alcohol use: Not Currently  . Drug use: No  . Sexual activity: Not Currently    Partners: Male    Comment: post menopausal  Other Topics Concern  . Not on file  Social History Narrative  . Not on file   Social Determinants of Health   Financial Resource Strain:   . Difficulty of Paying Living Expenses: Not on file  Food Insecurity:   . Worried About Sales executive in the Last Year: Not on file  . Ran Out of Food in the Last Year: Not on file  Transportation Needs:   . Lack of Transportation (Medical): Not on file  . Lack of Transportation (Non-Medical): Not on file  Physical Activity:   . Days of Exercise per Week: Not on file  . Minutes of Exercise per Session: Not on file  Stress:   . Feeling of Stress : Not on file  Social Connections:   . Frequency of Communication with Friends and Family: Not on file  . Frequency of Social Gatherings with Friends and Family: Not on file  . Attends Religious Services: Not on file  . Active Member of Clubs or Organizations: Not on file  . Attends Archivist Meetings: Not on file  . Marital Status: Not on file  Intimate Partner Violence:   . Fear of Current or Ex-Partner: Not on file  . Emotionally Abused: Not on file  . Physically Abused: Not on file  . Sexually Abused: Not on file    ROS  PHYSICAL EXAMINATION:    LMP 11/21/2015     General appearance: alert, cooperative and appears stated age Breasts: normal appearance, no masses or tenderness, slightly more nodularity in the upper outer quadrant of the left breast. No distinct lump   ASSESSMENT Breast lump noted at annual exam, negative imaging, feels better today.     PLAN Continue breast self exam F/U next year for annual exam TDAP today

## 2020-08-27 NOTE — Addendum Note (Signed)
Addended by: Yates Decamp on: 08/27/2020 03:38 PM   Modules accepted: Orders

## 2020-09-17 ENCOUNTER — Other Ambulatory Visit (HOSPITAL_BASED_OUTPATIENT_CLINIC_OR_DEPARTMENT_OTHER): Payer: Self-pay | Admitting: Physician Assistant

## 2020-09-17 DIAGNOSIS — Z1231 Encounter for screening mammogram for malignant neoplasm of breast: Secondary | ICD-10-CM

## 2020-09-21 ENCOUNTER — Other Ambulatory Visit: Payer: Self-pay

## 2020-09-21 ENCOUNTER — Encounter (HOSPITAL_BASED_OUTPATIENT_CLINIC_OR_DEPARTMENT_OTHER): Payer: Self-pay

## 2020-09-21 ENCOUNTER — Ambulatory Visit (HOSPITAL_BASED_OUTPATIENT_CLINIC_OR_DEPARTMENT_OTHER)
Admission: RE | Admit: 2020-09-21 | Discharge: 2020-09-21 | Disposition: A | Discharge status: Home / Self Care | Payer: 59 | Source: Ambulatory Visit | Attending: Obstetrics and Gynecology | Admitting: Obstetrics and Gynecology

## 2020-09-21 DIAGNOSIS — Z1231 Encounter for screening mammogram for malignant neoplasm of breast: Secondary | ICD-10-CM | POA: Insufficient documentation

## 2021-06-23 ENCOUNTER — Ambulatory Visit: Payer: 59 | Admitting: Obstetrics and Gynecology

## 2021-08-20 ENCOUNTER — Encounter (HOSPITAL_BASED_OUTPATIENT_CLINIC_OR_DEPARTMENT_OTHER): Payer: Self-pay

## 2021-08-26 ENCOUNTER — Other Ambulatory Visit (HOSPITAL_BASED_OUTPATIENT_CLINIC_OR_DEPARTMENT_OTHER): Payer: Self-pay | Admitting: Obstetrics & Gynecology

## 2021-08-26 DIAGNOSIS — Z1231 Encounter for screening mammogram for malignant neoplasm of breast: Secondary | ICD-10-CM

## 2021-08-27 ENCOUNTER — Ambulatory Visit (INDEPENDENT_AMBULATORY_CARE_PROVIDER_SITE_OTHER): Payer: 59 | Admitting: Obstetrics & Gynecology

## 2021-08-27 ENCOUNTER — Other Ambulatory Visit: Payer: Self-pay

## 2021-08-27 ENCOUNTER — Encounter (HOSPITAL_BASED_OUTPATIENT_CLINIC_OR_DEPARTMENT_OTHER): Payer: Self-pay | Admitting: Obstetrics & Gynecology

## 2021-08-27 VITALS — BP 110/83 | HR 67 | Ht 61.0 in | Wt 125.8 lb

## 2021-08-27 DIAGNOSIS — D251 Intramural leiomyoma of uterus: Secondary | ICD-10-CM | POA: Diagnosis not present

## 2021-08-27 DIAGNOSIS — Z23 Encounter for immunization: Secondary | ICD-10-CM | POA: Diagnosis not present

## 2021-08-27 DIAGNOSIS — Z0000 Encounter for general adult medical examination without abnormal findings: Secondary | ICD-10-CM

## 2021-08-27 DIAGNOSIS — Z780 Asymptomatic menopausal state: Secondary | ICD-10-CM | POA: Diagnosis not present

## 2021-08-27 DIAGNOSIS — Z01419 Encounter for gynecological examination (general) (routine) without abnormal findings: Secondary | ICD-10-CM | POA: Diagnosis not present

## 2021-08-27 DIAGNOSIS — K625 Hemorrhage of anus and rectum: Secondary | ICD-10-CM

## 2021-08-27 DIAGNOSIS — E875 Hyperkalemia: Secondary | ICD-10-CM

## 2021-08-27 NOTE — Progress Notes (Signed)
55 y.o. G0P0000 Married White or Caucasian female here for annual exam.  No vaginal bleeding.  Doing well.  Playing a lot of pickle ball.  She continues to see blood when has bowel movements.    Patient's last menstrual period was 11/21/2015.          Sexually active: No.  The current method of family planning is post menopausal status.    Exercising: Yes.    Pickle ball Smoker:  no  Health Maintenance: Pap:  06/03/19 neg, neg HR HPV History of abnormal Pap:  no MMG:  09/21/20 neg Colonoscopy:  04/12/16, Dr. Collene Mares BMD:  plan around age 50 TDaP:  08/27/20 Hep C testing: 03/09/16 neg   reports that she has never smoked. She has never used smokeless tobacco. She reports current alcohol use of about 2.0 standard drinks per week. She reports that she does not use drugs.  Past Medical History:  Diagnosis Date   Anxiety    Dysrhythmia    wore Holter monitor and per pt dr said he was ok with results   Family history of adverse reaction to anesthesia    paternal great uncle took 3 days to wake up, paternal cousin CPR during surgery   GERD (gastroesophageal reflux disease)    Hives 2013   Chronic     Past Surgical History:  Procedure Laterality Date   COLONOSCOPY WITH PROPOFOL     DILATATION & CURRETTAGE/HYSTEROSCOPY WITH RESECTOCOPE N/A 08/17/2015   Procedure: DILATATION & CURETTAGE/HYSTEROSCOPY;  Surgeon: Megan Salon, MD;  Location: [facility];  Service: Gynecology;  Laterality: N/A;   LAPAROSCOPY  1992   due to endometriosis - Lupron x 6 months    Current Outpatient Medications  Medication Sig Dispense Refill   Ascorbic Acid (VITAMIN C PO) Take by mouth.     b complex vitamins capsule      Cholecalciferol (VITAMIN D3) 25 MCG (1000 UT) CAPS      Multiple Vitamins-Minerals (ZINC PO) Take by mouth.     Omega-3 Fatty Acids (FISH OIL PO) Take 1 capsule by mouth daily.      Probiotic Product (PROBIOTIC PO) Take by mouth.     No current facility-administered medications for this visit.     Family History  Problem Relation Age of Onset   Hypertension Paternal Grandfather    Heart attack Paternal Grandfather    Depression Father    Anxiety disorder Sister     Review of Systems  All other systems reviewed and are negative.  Exam:   BP 110/83   Pulse 67   Ht 5\' 1"  (1.549 m)   Wt 125 lb 12.8 oz (57.1 kg)   LMP 11/21/2015   BMI 23.77 kg/m   Height: 5\' 1"  (154.9 cm)  General appearance: alert, cooperative and appears stated age Head: Normocephalic, without obvious abnormality, atraumatic Neck: no adenopathy, supple, symmetrical, trachea midline and thyroid normal to inspection and palpation Lungs: clear to auscultation bilaterally Breasts: normal appearance, no masses or tenderness Heart: regular rate and rhythm Abdomen: soft, non-tender; bowel sounds normal; no masses,  no organomegaly Extremities: extremities normal, atraumatic, no cyanosis or edema Skin: Skin color, texture, turgor normal. No rashes or lesions Lymph nodes: Cervical, supraclavicular, and axillary nodes normal. No abnormal inguinal nodes palpated Neurologic: Grossly normal   Pelvic: External genitalia:  no lesions              Urethra:  normal appearing urethra with no masses, tenderness or lesions  Bartholins and Skenes: normal                 Vagina: normal appearing vagina with normal color and no discharge, no lesions              Cervix: no lesions              Pap taken: No. Bimanual Exam:  Uterus:  normal size, contour, position, consistency, mobility, non-tender              Adnexa: normal adnexa and no mass, fullness, tenderness               Rectovaginal: Confirms               Anus:  normal sphincter tone, no lesions  Chaperone, Octaviano Batty, CMA, was present for exam.  Assessment/Plan:  1. Well woman exam with routine gynecological exam - pap with neg HR HPV - MMG scheduled for October - BMD will be planned around age 11 - colonoscopy 2017, lymphoid polyp -  lab work ordered for today - Care Gaps reviewed/updated  2. Postmenopausal - no HRT  3. Blood tests for routine general physical examination - VITAMIN D 25 Hydroxy (Vit-D Deficiency, Fractures) - Lipid panel - Comprehensive metabolic panel  4. Intramural leiomyoma of uterus  5. Rectal bleeding - referral to GI.  Release for records signed so this will be in Epic.

## 2021-08-28 LAB — LIPID PANEL
Chol/HDL Ratio: 2 ratio (ref 0.0–4.4)
Cholesterol, Total: 236 mg/dL — ABNORMAL HIGH (ref 100–199)
HDL: 118 mg/dL (ref >39)
LDL Chol Calc (NIH): 111 mg/dL — ABNORMAL HIGH (ref 0–99)
Triglycerides: 39 mg/dL (ref 0–149)
VLDL Cholesterol Cal: 7 mg/dL (ref 5–40)

## 2021-08-28 LAB — VITAMIN D 25 HYDROXY (VIT D DEFICIENCY, FRACTURES): Vit D, 25-Hydroxy: 46.3 ng/mL (ref 30.0–100.0)

## 2021-08-28 LAB — COMPREHENSIVE METABOLIC PANEL
ALT: 19 IU/L (ref 0–32)
AST: 20 IU/L (ref 0–40)
Albumin/Globulin Ratio: 2.1 (ref 1.2–2.2)
Albumin: 4.5 g/dL (ref 3.8–4.9)
Alkaline Phosphatase: 85 IU/L (ref 44–121)
BUN/Creatinine Ratio: 12 (ref 9–23)
BUN: 13 mg/dL (ref 6–24)
Bilirubin Total: 0.6 mg/dL (ref 0.0–1.2)
CO2: 25 mmol/L (ref 20–29)
Calcium: 9.8 mg/dL (ref 8.7–10.2)
Chloride: 105 mmol/L (ref 96–106)
Creatinine, Ser: 1.06 mg/dL — ABNORMAL HIGH (ref 0.57–1.00)
Globulin, Total: 2.1 g/dL (ref 1.5–4.5)
Glucose: 85 mg/dL (ref 70–99)
Potassium: 5.4 mmol/L — ABNORMAL HIGH (ref 3.5–5.2)
Sodium: 143 mmol/L (ref 134–144)
Total Protein: 6.6 g/dL (ref 6.0–8.5)
eGFR: 62 mL/min/{1.73_m2} (ref >59)

## 2021-09-02 ENCOUNTER — Encounter (HOSPITAL_BASED_OUTPATIENT_CLINIC_OR_DEPARTMENT_OTHER): Payer: Self-pay | Admitting: Obstetrics & Gynecology

## 2021-09-03 NOTE — Addendum Note (Signed)
Addended by: Megan Salon on: 09/03/2021 11:18 AM   Modules accepted: Orders

## 2021-09-07 ENCOUNTER — Other Ambulatory Visit: Payer: Self-pay

## 2021-09-07 ENCOUNTER — Other Ambulatory Visit (HOSPITAL_BASED_OUTPATIENT_CLINIC_OR_DEPARTMENT_OTHER): Payer: 59

## 2021-09-07 DIAGNOSIS — E875 Hyperkalemia: Secondary | ICD-10-CM

## 2021-09-08 LAB — COMPREHENSIVE METABOLIC PANEL
ALT: 18 IU/L (ref 0–32)
AST: 21 IU/L (ref 0–40)
Albumin/Globulin Ratio: 1.9 (ref 1.2–2.2)
Albumin: 4.4 g/dL (ref 3.8–4.9)
Alkaline Phosphatase: 92 IU/L (ref 44–121)
BUN/Creatinine Ratio: 15 (ref 9–23)
BUN: 15 mg/dL (ref 6–24)
Bilirubin Total: 0.4 mg/dL (ref 0.0–1.2)
CO2: 23 mmol/L (ref 20–29)
Calcium: 9.8 mg/dL (ref 8.7–10.2)
Chloride: 100 mmol/L (ref 96–106)
Creatinine, Ser: 1.03 mg/dL — ABNORMAL HIGH (ref 0.57–1.00)
Globulin, Total: 2.3 g/dL (ref 1.5–4.5)
Glucose: 90 mg/dL (ref 70–99)
Potassium: 4.8 mmol/L (ref 3.5–5.2)
Sodium: 138 mmol/L (ref 134–144)
Total Protein: 6.7 g/dL (ref 6.0–8.5)
eGFR: 64 mL/min/{1.73_m2} (ref >59)

## 2021-09-09 ENCOUNTER — Other Ambulatory Visit (HOSPITAL_BASED_OUTPATIENT_CLINIC_OR_DEPARTMENT_OTHER): Payer: 59

## 2021-09-27 ENCOUNTER — Other Ambulatory Visit: Payer: Self-pay

## 2021-09-27 ENCOUNTER — Encounter (HOSPITAL_BASED_OUTPATIENT_CLINIC_OR_DEPARTMENT_OTHER): Payer: Self-pay

## 2021-09-27 ENCOUNTER — Ambulatory Visit (HOSPITAL_BASED_OUTPATIENT_CLINIC_OR_DEPARTMENT_OTHER)
Admission: RE | Admit: 2021-09-27 | Discharge: 2021-09-27 | Disposition: A | Discharge status: Home / Self Care | Payer: 59 | Source: Ambulatory Visit | Attending: Obstetrics & Gynecology | Admitting: Obstetrics & Gynecology

## 2021-09-27 DIAGNOSIS — Z1231 Encounter for screening mammogram for malignant neoplasm of breast: Secondary | ICD-10-CM

## 2021-10-13 ENCOUNTER — Encounter: Payer: Self-pay | Admitting: Internal Medicine

## 2021-11-03 ENCOUNTER — Ambulatory Visit (INDEPENDENT_AMBULATORY_CARE_PROVIDER_SITE_OTHER): Payer: 59 | Admitting: Internal Medicine

## 2021-11-03 ENCOUNTER — Other Ambulatory Visit (INDEPENDENT_AMBULATORY_CARE_PROVIDER_SITE_OTHER): Payer: 59

## 2021-11-03 ENCOUNTER — Encounter: Payer: Self-pay | Admitting: Internal Medicine

## 2021-11-03 VITALS — BP 90/60 | HR 70 | Ht 61.0 in | Wt 131.0 lb

## 2021-11-03 DIAGNOSIS — K625 Hemorrhage of anus and rectum: Secondary | ICD-10-CM | POA: Diagnosis not present

## 2021-11-03 DIAGNOSIS — K9041 Non-celiac gluten sensitivity: Secondary | ICD-10-CM | POA: Diagnosis not present

## 2021-11-03 LAB — IBC + FERRITIN
Ferritin: 35.1 ng/mL (ref 10.0–291.0)
Iron: 128 ug/dL (ref 42–145)
Saturation Ratios: 36 % (ref 20.0–50.0)
TIBC: 355.6 ug/dL (ref 250.0–450.0)
Transferrin: 254 mg/dL (ref 212.0–360.0)

## 2021-11-03 LAB — CBC WITH DIFFERENTIAL/PLATELET
Basophils Absolute: 0 10*3/uL (ref 0.0–0.1)
Basophils Relative: 0.4 % (ref 0.0–3.0)
Eosinophils Absolute: 0 10*3/uL (ref 0.0–0.7)
Eosinophils Relative: 0.8 % (ref 0.0–5.0)
HCT: 44.1 % (ref 36.0–46.0)
Hemoglobin: 14.7 g/dL (ref 12.0–15.0)
Lymphocytes Relative: 35.6 % (ref 12.0–46.0)
Lymphs Abs: 2 10*3/uL (ref 0.7–4.0)
MCHC: 33.3 g/dL (ref 30.0–36.0)
MCV: 91.9 fl (ref 78.0–100.0)
Monocytes Absolute: 0.3 10*3/uL (ref 0.1–1.0)
Monocytes Relative: 5.7 % (ref 3.0–12.0)
Neutro Abs: 3.2 10*3/uL (ref 1.4–7.7)
Neutrophils Relative %: 57.5 % (ref 43.0–77.0)
Platelets: 162 10*3/uL (ref 150.0–400.0)
RBC: 4.79 Mil/uL (ref 3.87–5.11)
RDW: 13.4 % (ref 11.5–15.5)
WBC: 5.6 10*3/uL (ref 4.0–10.5)

## 2021-11-03 NOTE — Progress Notes (Signed)
Chief Complaint: Rectal bleeding  HPI : 55 year old female with history of GERD, hemorrhoids, and anxiety presents with rectal bleeding  She has been having issues with rectal bleeding. The bleeding has been worse for the last 2 years, though she has had very scant rectal bleeding ever since her last colonoscopy 5 years ago. She has gone to see her OB who told her that she has an anal fissure. She has stinging on occasion, but denies any overt rectal pain currently. She has not tried anything to treat anal fissure or hemorrhoids since she was not sure what was causing her rectal bleeding. She has been having alternating constipation and diarrhea. She has on average 1-2 BMs per day. She doesn't always feel like she is adequtaely cleared out when she has a BM She plays pickleball, and she has occasionaly had pain in her lower RLQ that has been happening for while, which she has attributed to musculoskeletal issues. She has a sensitivity to gluten but has never been diagnosed with celiac disease. Her last colonoscopy was done about 5 years ago with Dr. Collene Mares that showed a benign colon polyp and hemorrhoids. She did have a normal colonoscopy prior to her most recent colonoscopy   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   Chronic      Past Surgical History:  Procedure Laterality Date   COLONOSCOPY WITH PROPOFOL     DILATATION & CURRETTAGE/HYSTEROSCOPY WITH RESECTOCOPE N/A 08/17/2015   Procedure: DILATATION & CURETTAGE/HYSTEROSCOPY;  Surgeon: Megan Salon, MD;  Location: [facility];  Service: Gynecology;  Laterality: N/A;   LAPAROSCOPY  1992   due to endometriosis - Lupron x 6 months   Family History   Problem Relation Age of Onset   Hypertension Paternal Grandfather    Heart attack Paternal Grandfather    Depression Father    Anxiety disorder Sister    Social History   Tobacco Use   Smoking status: Never   Smokeless tobacco: Never  Vaping Use   Vaping Use: Never used  Substance Use Topics   Alcohol use: Yes    Alcohol/week: 2.0 standard drinks    Types: 1 Cans of beer, 1 Shots of liquor per week   Drug use: No   Current Outpatient Medications  Medication Sig Dispense Refill   Ascorbic Acid (VITAMIN C PO) Take by mouth.     b complex vitamins capsule      Cholecalciferol (VITAMIN D3) 25 MCG (1000 UT) CAPS      Multiple Vitamins-Minerals (ZINC PO) Take by mouth.     Omega-3 Fatty Acids (FISH OIL PO) Take 1 capsule by mouth daily.      Probiotic Product (PROBIOTIC PO) Take by mouth.     No current facility-administered medications for this visit.   Allergies  Allergen Reactions   Cetirizine Hives   Fexofenadine Anxiety and Hives   Levofloxacin Hives   Nitrofurantoin Monohyd Macro Hives   Antihistamines, Diphenhydramine-Type Anxiety   Other Anxiety    Anti Histamines   Review of Systems: All systems reviewed and negative except where noted in HPI.   Physical Exam:  BP 90/60   Pulse 70   Ht 5\' 1"  (1.549 m)   Wt 131 lb (59.4 kg)   LMP 11/21/2015   BMI 24.75 kg/m  Constitutional: Pleasant,well-developed, female in no acute distress. HEENT: Normocephalic and atraumatic. Conjunctivae are normal. No scleral icterus. Cardiovascular: Normal rate, regular rhythm.  Pulmonary/chest: Effort normal and breath sounds normal. No wheezing, rales or rhonchi. Abdominal: Soft, nondistended, nontender. Bowel sounds active throughout. There are no masses palpable. No hepatomegaly. Extremities: No edema Neurological: Alert and oriented to person place and time. Skin: Skin is warm and dry. No rashes noted. Psychiatric: Normal mood and affect. Behavior is normal.  Labs 05/2020:  CBC nml.  Labs 08/2021: CMP with mildly elevated Cr of 1.03  Colonoscopy 06/17/16:  Path:   ASSESSMENT AND PLAN: Rectal bleeding Gluten sensitivity Patient presents with a recent worsening of her rectal bleeding over the last 2 years.  Has been told by her OB in the past that she may have an anal fissure or hemorrhoids.  We will plan to grab some labs today to rule out significant drop in her blood counts and iron deficiency.  Will also plan for a colonoscopy to rule out malignancy as a source of her rectal bleeding.  Since the patient also has gluten sensitivity has never been formally tested for celiac disease, we will plan to get TTG IgA and IgA with her labs today. - Check CBC, ferritin, iron/TIBC, TTG IgA, IgA - Colonoscopy [facility]  Christia Reading, MD

## 2021-11-03 NOTE — Patient Instructions (Signed)
Your provider has requested that you go to the basement level for lab work before leaving today. Press "B" on the elevator. The lab is located at the first door on the left as you exit the elevator.  You have been scheduled for a colonoscopy. Please follow written instructions given to you at your visit today.  Please pick up your prep supplies at the pharmacy within the next 1-3 days. If you use inhalers (even only as needed), please bring them with you on the day of your procedure.  Due to recent changes in healthcare laws, you may see the results of your imaging and laboratory studies on MyChart before your provider has had a chance to review them.  We understand that in some cases there may be results that are confusing or concerning to you. Not all laboratory results come back in the same time frame and the provider may be waiting for multiple results in order to interpret others.  Please give us 48 hours in order for your provider to thoroughly review all the results before contacting the office for clarification of your results.   The [facility] providers would like to encourage you to use MYCHART to communicate with providers for non-urgent requests or questions.  Due to long hold times on the telephone, sending your provider a message by MYCHART may be a faster and more efficient way to get a response.  Please allow 48 business hours for a response.  Please remember that this is for non-urgent requests.   

## 2021-11-04 ENCOUNTER — Encounter: Payer: Self-pay | Admitting: Internal Medicine

## 2021-11-04 LAB — IGA: Immunoglobulin A: 190 mg/dL (ref 47–310)

## 2021-11-04 LAB — TISSUE TRANSGLUTAMINASE, IGA: (tTG) Ab, IgA: <1 U/mL

## 2021-11-05 ENCOUNTER — Ambulatory Visit (AMBULATORY_SURGERY_CENTER): Payer: 59 | Admitting: Internal Medicine

## 2021-11-05 ENCOUNTER — Encounter: Payer: Self-pay | Admitting: Internal Medicine

## 2021-11-05 ENCOUNTER — Other Ambulatory Visit: Payer: Self-pay

## 2021-11-05 VITALS — BP 101/63 | HR 62 | Temp 97.9°F | Resp 14 | Ht 61.0 in | Wt 131.0 lb

## 2021-11-05 DIAGNOSIS — K921 Melena: Secondary | ICD-10-CM

## 2021-11-05 DIAGNOSIS — K625 Hemorrhage of anus and rectum: Secondary | ICD-10-CM

## 2021-11-05 DIAGNOSIS — D122 Benign neoplasm of ascending colon: Secondary | ICD-10-CM

## 2021-11-05 DIAGNOSIS — K649 Unspecified hemorrhoids: Secondary | ICD-10-CM

## 2021-11-05 DIAGNOSIS — K635 Polyp of colon: Secondary | ICD-10-CM

## 2021-11-05 DIAGNOSIS — D123 Benign neoplasm of transverse colon: Secondary | ICD-10-CM

## 2021-11-05 DIAGNOSIS — K648 Other hemorrhoids: Secondary | ICD-10-CM

## 2021-11-05 MED ORDER — SODIUM CHLORIDE 0.9 % IV SOLN: 500.0000 mL | Freq: Once | INTRAVENOUS | Status: DC

## 2021-11-05 MED ORDER — HYDROCORTISONE (PERIANAL) 2.5 % EX CREA: 1.0000 "application " | TOPICAL_CREAM | Freq: Two times a day (BID) | CUTANEOUS | 1 refills | Status: AC

## 2021-11-05 NOTE — Op Note (Signed)
[facility] Patient Name: Laurie Parsons Procedure Date: 11/05/2021 8:43 AM MRN: [id number] Endoscopist: Sonny Masters "Laurie Parsons ,  Age: 55 Referring MD:  Date of Birth: 04/15/1966 Gender: Female Account #: [id number] Procedure:                Colonoscopy Indications:              Hematochezia Medicines:                Monitored Anesthesia Care Procedure:                Pre-Anesthesia Assessment:                           - Prior to the procedure, a History and Physical                            was performed, and patient medications and                            allergies were reviewed. The patient's tolerance of                            previous anesthesia was also reviewed. The risks                            and benefits of the procedure and the sedation                            options and risks were discussed with the patient.                            All questions were answered, and informed consent                            was obtained. Prior Anticoagulants: The patient has                            taken no previous anticoagulant or antiplatelet                            agents. ASA Grade Assessment: II - A patient with                            mild systemic disease. After reviewing the risks                            and benefits, the patient was deemed in                            satisfactory condition to undergo the procedure.                           After obtaining informed consent, the colonoscope  was passed under direct vision. Throughout the                            procedure, the patient's blood pressure, pulse, and                            oxygen saturations were monitored continuously. The                            Colonoscope was introduced through the anus and                            advanced to the the terminal ileum. The colonoscopy                            was performed without difficulty.  The patient                            tolerated the procedure well. The quality of the                            bowel preparation was good. The terminal ileum,                            ileocecal valve, appendiceal orifice, and rectum                            were photographed. Scope In: 8:52:23 AM Scope Out: 9:15:21 AM Scope Withdrawal Time: 0 hours 16 minutes 25 seconds  Total Procedure Duration: 0 hours 22 minutes 58 seconds  Findings:                 Two sessile polyps were found in the transverse                            colon and ascending colon. The polyps were 3 to 7                            mm in size. These polyps were removed with a cold                            snare. Resection and retrieval were complete.                           Non-bleeding internal hemorrhoids were found during                            retroflexion. There also appeared to be a healed                            anal fissure present. Complications:            No immediate complications. Estimated Blood Loss:     Estimated blood loss was minimal. Impression:               -  Two 3 to 7 mm polyps in the transverse colon and                            in the ascending colon, removed with a cold snare.                            Resected and retrieved.                           - Non-bleeding internal hemorrhoids. Healed anal                            fissure. Recommendation:           - Discharge patient to home (with escort).                           - It is suspected that your rectal bleeding is due                            to hemorrhoids, you also had signs of a healed anal                            fissure.                           - If you are still havnig bleeding issues, then you                            can pick up Anusol HC, which is a hemorrhoid cream                            that I have sent to your pharmacy.                           - Await pathology results.                            - The findings and recommendations were discussed                            with the patient. Sonny Masters "Laurie Parsons" [place],  11/05/2021 9:22:08 AM

## 2021-11-05 NOTE — Patient Instructions (Signed)
Read all of the handouts given to you by your recovery room nurse.   There is anusol cream for you at your pharmacy if your hemorrhoids bother you.  YOU HAD AN ENDOSCOPIC PROCEDURE TODAY AT [facility]:   Refer to the procedure report that was given to you for any specific questions about what was found during the examination.  If the procedure report does not answer your questions, please call your gastroenterologist to clarify.  If you requested that your care partner not be given the details of your procedure findings, then the procedure report has been included in a sealed envelope for you to review at your convenience later.  YOU SHOULD EXPECT: Some feelings of bloating in the abdomen. Passage of more gas than usual.  Walking can help get rid of the air that was put into your GI tract during the procedure and reduce the bloating. If you had a lower endoscopy (such as a colonoscopy or flexible sigmoidoscopy) you may notice spotting of blood in your stool or on the toilet paper. If you underwent a bowel prep for your procedure, you may not have a normal bowel movement for a few days.  Please Note:  You might notice some irritation and congestion in your nose or some drainage.  This is from the oxygen used during your procedure.  There is no need for concern and it should clear up in a day or so.  SYMPTOMS TO REPORT IMMEDIATELY:  Following lower endoscopy (colonoscopy or flexible sigmoidoscopy):  Excessive amounts of blood in the stool  Significant tenderness or worsening of abdominal pains  Swelling of the abdomen that is new, acute  Fever of 100F or higher   For urgent or emergent issues, a gastroenterologist can be reached at any hour by calling [phone]. Do not use MyChart messaging for urgent concerns.    DIET:  We do recommend a small meal at first, but then you may proceed to your regular diet.  Drink plenty of fluids but you should avoid alcoholic beverages for  24 hours.  ACTIVITY:  You should plan to take it easy for the rest of today and you should NOT DRIVE or use heavy machinery until tomorrow (because of the sedation medicines used during the test).    FOLLOW UP: Our staff will call the number listed on your records 48-72 hours following your procedure to check on you and address any questions or concerns that you may have regarding the information given to you following your procedure. If we do not reach you, we will leave a message.  We will attempt to reach you two times.  During this call, we will ask if you have developed any symptoms of COVID 19. If you develop any symptoms (ie: fever, flu-like symptoms, shortness of breath, cough etc.) before then, please call [phone].  If you test positive for Covid 19 in the 2 weeks post procedure, please call and report this information to [place].    If any biopsies were taken you will be contacted by phone or by letter within the next 1-3 weeks.  Please call us at [phone] if you have not heard about the biopsies in 3 weeks.    SIGNATURES/CONFIDENTIALITY: You and/or your care partner have signed paperwork which will be entered into your electronic medical record.  These signatures attest to the fact that that the information above on your After Visit Summary has been reviewed and is understood.  Full responsibility of the  confidentiality of this discharge information lies with you and/or your care-partner.  

## 2021-11-05 NOTE — Progress Notes (Signed)
Pt's states no medical or surgical changes since previsit or office visit. 

## 2021-11-05 NOTE — Progress Notes (Signed)
Called to room to assist during endoscopic procedure.  Patient ID and intended procedure confirmed with present staff. Received instructions for my participation in the procedure from the performing physician.  

## 2021-11-05 NOTE — Progress Notes (Signed)
GASTROENTEROLOGY PROCEDURE H&P NOTE   Primary Care Physician: Doyle Askew, PA-C    Reason for Procedure:   Rectal bleeding  Plan:    Colonoscopy  Patient is appropriate for endoscopic procedure(s) in the ambulatory ([facility]) setting.  The nature of the procedure, as well as the risks, benefits, and alternatives were carefully and thoroughly reviewed with the patient. Ample time for discussion and questions allowed. The patient understood, was satisfied, and agreed to proceed.     HPI: Laurie Parsons is a 55 y.o. female who presents for colonoscopy for evaluation of rectal bleeding .  Patient was most recently seen in the Gastroenterology Clinic on 11/03/21.  No interval change in medical history since that appointment. Please refer to that note for full details regarding GI history and clinical presentation.   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 Authorizing Provider  Ascorbic Acid (VITAMIN C PO) Take by mouth.   Yes [provider]  b complex vitamins capsule    Yes [provider]  Cholecalciferol (VITAMIN D3) 25 MCG (1000 UT) CAPS  02/27/16  Yes [provider]  Multiple Vitamins-Minerals (ZINC  PO) Take by mouth.   Yes [provider]  Omega-3 Fatty Acids (FISH OIL PO) Take 1 capsule by mouth daily.    Yes [provider]  Probiotic Product (PROBIOTIC PO) Take by mouth.   Yes [provider]    Current Outpatient Medications  Medication Sig Dispense Refill   Ascorbic Acid (VITAMIN C PO) Take by mouth.     b complex vitamins capsule      Cholecalciferol (VITAMIN D3) 25 MCG (1000 UT) CAPS      Multiple Vitamins-Minerals (ZINC PO) Take by mouth.     Omega-3 Fatty Acids (FISH OIL PO) Take 1 capsule by mouth daily.      Probiotic Product (PROBIOTIC PO) Take by mouth.     Current Facility-Administered Medications  Medication Dose Route Frequency Provider Last Rate Last Admin   0.9 %  sodium chloride infusion  500 mL Intravenous Once Sharyn Creamer, MD        Allergies as of 11/05/2021 - Review Complete 11/05/2021  Allergen Reaction Noted   Cetirizine Hives 02/22/2012   Fexofenadine Anxiety and Hives 02/22/2012   Levofloxacin Hives 10/09/2010   Nitrofurantoin monohyd macro Hives 02/13/2012   Antihistamines, diphenhydramine-type Anxiety 05/22/2015   Other Anxiety 02/04/2014    Family History  Problem Relation Age of Onset   Depression Father    Anxiety disorder Sister    Hypertension Paternal Grandfather    Heart attack Paternal Grandfather    Colon cancer Neg Hx  Esophageal cancer Neg Hx    Stomach cancer Neg Hx    Rectal cancer Neg Hx     Social History   Socioeconomic History   Marital status: Married    Spouse name: Not on file   Number of children: Not on file   Years of education: Not on file   Highest education level: Not on file  Occupational History   Not on file  Tobacco Use   Smoking status: Never   Smokeless tobacco: Never  Vaping Use   Vaping Use: Never used  Substance and Sexual Activity   Alcohol use: Yes    Alcohol/week: 2.0 standard drinks    Types: 1 Cans of beer, 1 Shots of liquor per week   Drug use: No    Sexual activity: Not Currently    Partners: Male    Birth control/protection: Post-menopausal  Other Topics Concern   Not on file  Social History Narrative   Not on file   Social Determinants of Health   Financial Resource Strain: Not on file  Food Insecurity: Not on file  Transportation Needs: Not on file  Physical Activity: Not on file  Stress: Not on file  Social Connections: Not on file  Intimate Partner Violence: Not on file    Physical Exam: Vital signs in last 24 hours: BP 104/65   Pulse 80   Temp 97.9 F (36.6 C) (Temporal)   Ht 5\' 1"  (1.549 m)   Wt 131 lb (59.4 kg)   LMP 11/21/2015   SpO2 98%   BMI 24.75 kg/m  GEN: NAD EYE: Sclerae anicteric ENT: MMM CV: Non-tachycardic Pulm: No increased WOB GI: Soft NEURO:  Alert & Oriented   Christia Reading, MD [facility]   11/05/2021 8:45 AM

## 2021-11-05 NOTE — Progress Notes (Signed)
Sedate, gd SR, tolerated procedure well, VSS, report to RN 

## 2021-11-09 ENCOUNTER — Encounter: Payer: Self-pay | Admitting: *Deleted

## 2021-11-09 ENCOUNTER — Telehealth: Payer: Self-pay | Admitting: *Deleted

## 2021-11-09 NOTE — Telephone Encounter (Signed)
Accidentally opened a second encounter for the same call.

## 2021-11-09 NOTE — Telephone Encounter (Signed)
°  Follow up Call-  Call back number 11/05/2021  Post procedure Call Back phone  # [phone]  Permission to leave phone message Yes  Some recent data might be hidden     Patient questions:  Do you have a fever, pain , or abdominal swelling? No. Pain Score  0 *  Have you tolerated food without any problems? Yes.    Have you been able to return to your normal activities? Yes.    Do you have any questions about your discharge instructions: Diet   No. Medications  No. Follow up visit  No.  Do you have questions or concerns about your Care? No.  Actions: * If pain score is 4 or above: No action needed, pain <4.  Have you developed a fever since your procedure? no  2.   Have you had an respiratory symptoms (SOB or cough) since your procedure? no  3.   Have you tested positive for COVID 19 since your procedure no  4.   Have you had any family members/close contacts diagnosed with the COVID 19 since your procedure?  no   If yes to any of these questions please route to Joylene John, RN and Joella Prince, RN

## 2021-11-09 NOTE — Telephone Encounter (Signed)
Attempted to call patient for their post-procedure follow-up call. No answer. Left voicemail.   

## 2021-11-10 ENCOUNTER — Encounter: Payer: Self-pay | Admitting: Internal Medicine

## 2022-09-22 ENCOUNTER — Ambulatory Visit (HOSPITAL_BASED_OUTPATIENT_CLINIC_OR_DEPARTMENT_OTHER): Payer: 59 | Admitting: Obstetrics & Gynecology

## 2022-11-08 ENCOUNTER — Encounter (HOSPITAL_BASED_OUTPATIENT_CLINIC_OR_DEPARTMENT_OTHER): Payer: Self-pay | Admitting: Advanced Practice Midwife

## 2022-11-08 ENCOUNTER — Other Ambulatory Visit (HOSPITAL_COMMUNITY)
Admission: RE | Admit: 2022-11-08 | Discharge: 2022-11-08 | Disposition: A | Discharge status: Home / Self Care | Payer: 59 | Source: Ambulatory Visit | Attending: Advanced Practice Midwife | Admitting: Advanced Practice Midwife

## 2022-11-08 ENCOUNTER — Ambulatory Visit (INDEPENDENT_AMBULATORY_CARE_PROVIDER_SITE_OTHER): Payer: 59 | Admitting: Advanced Practice Midwife

## 2022-11-08 VITALS — BP 120/80 | HR 68 | Ht 64.0 in | Wt 140.4 lb

## 2022-11-08 DIAGNOSIS — Z01419 Encounter for gynecological examination (general) (routine) without abnormal findings: Secondary | ICD-10-CM | POA: Diagnosis not present

## 2022-11-08 DIAGNOSIS — Z1231 Encounter for screening mammogram for malignant neoplasm of breast: Secondary | ICD-10-CM | POA: Diagnosis not present

## 2022-11-08 DIAGNOSIS — Z124 Encounter for screening for malignant neoplasm of cervix: Secondary | ICD-10-CM | POA: Insufficient documentation

## 2022-11-08 DIAGNOSIS — Z0000 Encounter for general adult medical examination without abnormal findings: Secondary | ICD-10-CM

## 2022-11-08 NOTE — Progress Notes (Signed)
Subjective:     Laurie Parsons is a 56 y.o. female here at [facility] for a routine exam.  Current complaints: none.  Personal health questionnaire reviewed: yes.  Do you have a primary care provider? no Do you feel safe at home? yes  Flowsheet Row Office Visit from 11/08/2022 in [facility]  PHQ-2 Total Score 0       Health Maintenance Due  Topic Date Due   Zoster Vaccines- Shingrix (1 of 2) Never done   PAP SMEAR-Modifier  06/02/2022   INFLUENZA VACCINE  06/28/2022   COVID-19 Vaccine (4 - 2023-24 season) 07/29/2022     Risk factors for chronic health problems: Smoking: Never Alchohol/how much: 2 drinks per week Pt BMI: Body mass index is 24.1 kg/m.   Gynecologic History Patient's last menstrual period was 11/21/2015. Contraception: post menopausal status Last Pap: 2020. Results were: normal Last mammogram: 09/27/21. Results were: normal  Obstetric History OB History  Gravida Para Term Preterm AB Living  0 0 0 0 0 0  SAB IAB Ectopic Multiple Live Births  0 0 0 0       The following portions of the patient's history were reviewed and updated as appropriate: allergies, current medications, past family history, past medical history, past social history, past surgical history, and problem list.  Review of Systems Pertinent items noted in HPI and remainder of comprehensive ROS otherwise negative.    Objective:  BP 120/80 (BP Location: Right Arm, Patient Position: Sitting, Cuff Size: Normal)   Pulse 68   Ht 5' 4" (1.626 m) Comment: Reported  Wt 140 lb 6.4 oz (63.7 kg)   LMP 11/21/2015   BMI 24.10 kg/m    VS reviewed, nursing note reviewed,  Constitutional: well developed, well nourished, no distress HEENT: normocephalic, thyroid without enlargement or mass HEART: RRR, no murmurs rubs/gallops RESP: clear and equal to auscultation bilaterally in all lobes  Breast Exam:  exam performed: right breast normal without mass, skin or  nipple changes or axillary nodes, left breast normal without mass, skin or nipple changes or axillary nodes Abdomen: soft Neuro: alert and oriented x 3 Skin: warm, dry Psych: affect normal Pelvic exam: Performed: Cervix pink, visually closed, without lesion, scant white creamy discharge, vaginal walls and external genitalia normal Bimanual exam: Cervix 0/long/high, firm, anterior, neg CMT, uterus nontender, nonenlarged, adnexa without tenderness, enlargement, or mass        Assessment/Plan:   1. Well woman exam with routine gynecological exam --No gyn concerns or problems --Vaginal dryness postmenopause, improved with coconut oil daily  - Comp Met (CMET); Future - Vitamin D (25 hydroxy); Future - Lipid panel; Future  2. Blood tests for routine general physical examination      Return in about 1 year (around 11/09/2023) for annual exam, plus lab only visit as soon as possible for am appt for fasting labs.   Lisa Leftwich-Kirby, CNM 2:14 PM   

## 2022-11-10 ENCOUNTER — Other Ambulatory Visit (HOSPITAL_BASED_OUTPATIENT_CLINIC_OR_DEPARTMENT_OTHER): Payer: 59

## 2022-11-10 DIAGNOSIS — Z01419 Encounter for gynecological examination (general) (routine) without abnormal findings: Secondary | ICD-10-CM

## 2022-11-10 LAB — CYTOLOGY - PAP
Comment: NEGATIVE
Diagnosis: NEGATIVE
High risk HPV: NEGATIVE

## 2022-11-10 LAB — LIPID PANEL
Chol/HDL Ratio: 2 ratio (ref 0.0–4.4)
Cholesterol, Total: 256 mg/dL — ABNORMAL HIGH (ref 100–199)
HDL: 129 mg/dL (ref >39)
LDL Chol Calc (NIH): 120 mg/dL — ABNORMAL HIGH (ref 0–99)
Triglycerides: 44 mg/dL (ref 0–149)
VLDL Cholesterol Cal: 7 mg/dL (ref 5–40)

## 2022-11-11 LAB — COMPREHENSIVE METABOLIC PANEL
ALT: 15 IU/L (ref 0–32)
AST: 20 IU/L (ref 0–40)
Albumin/Globulin Ratio: 1.8 (ref 1.2–2.2)
Albumin: 4.2 g/dL (ref 3.8–4.9)
Alkaline Phosphatase: 86 IU/L (ref 44–121)
BUN/Creatinine Ratio: 17 (ref 9–23)
BUN: 16 mg/dL (ref 6–24)
Bilirubin Total: 0.7 mg/dL (ref 0.0–1.2)
CO2: 24 mmol/L (ref 20–29)
Calcium: 9.8 mg/dL (ref 8.7–10.2)
Chloride: 101 mmol/L (ref 96–106)
Creatinine, Ser: 0.96 mg/dL (ref 0.57–1.00)
Globulin, Total: 2.4 g/dL (ref 1.5–4.5)
Glucose: 85 mg/dL (ref 70–99)
Potassium: 4.2 mmol/L (ref 3.5–5.2)
Sodium: 140 mmol/L (ref 134–144)
Total Protein: 6.6 g/dL (ref 6.0–8.5)
eGFR: 69 mL/min/{1.73_m2} (ref >59)

## 2022-11-11 LAB — VITAMIN D 25 HYDROXY (VIT D DEFICIENCY, FRACTURES): Vit D, 25-Hydroxy: 40.4 ng/mL (ref 30.0–100.0)

## 2022-11-14 ENCOUNTER — Ambulatory Visit (HOSPITAL_BASED_OUTPATIENT_CLINIC_OR_DEPARTMENT_OTHER)
Admission: RE | Admit: 2022-11-14 | Discharge: 2022-11-14 | Disposition: A | Discharge status: Home / Self Care | Payer: 59 | Source: Ambulatory Visit | Attending: Advanced Practice Midwife | Admitting: Advanced Practice Midwife

## 2022-11-14 DIAGNOSIS — Z1231 Encounter for screening mammogram for malignant neoplasm of breast: Secondary | ICD-10-CM | POA: Diagnosis not present

## 2023-01-05 IMAGING — MG MM DIGITAL SCREENING BILAT W/ TOMO AND CAD
8 series · 9 of 24 positions shown · non-contrast
Comparison: Previous exam(s).

CLINICAL DATA: Screening.

EXAM:
DIGITAL SCREENING BILATERAL MAMMOGRAM WITH TOMOSYNTHESIS AND CAD
TECHNIQUE: Bilateral screening digital craniocaudal and mediolateral oblique
mammograms were obtained. Bilateral screening digital breast
tomosynthesis was performed. The images were evaluated with
computer-aided detection.

[R MLO synth-2D]
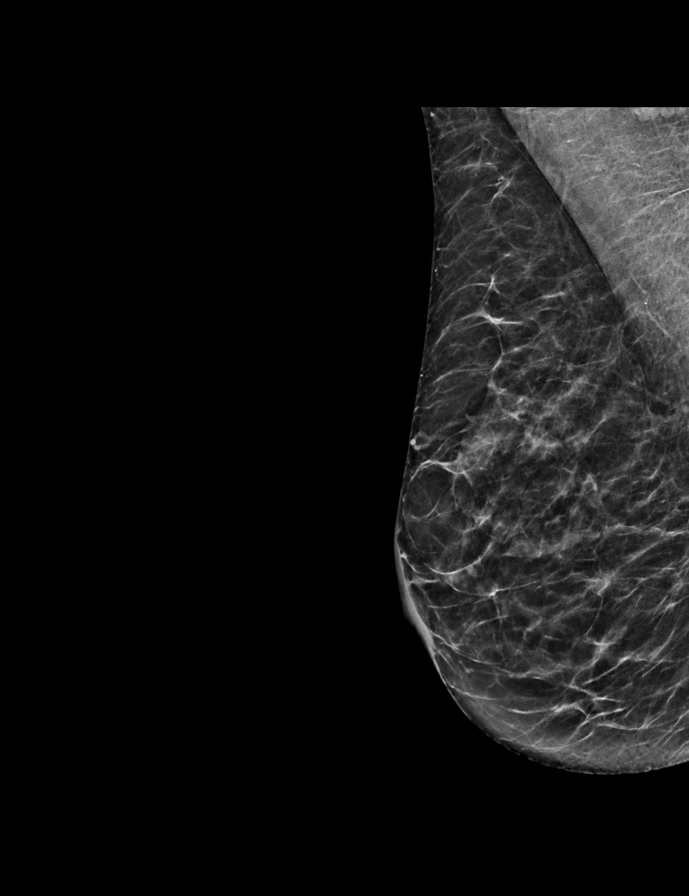

[L CC synth-2D]
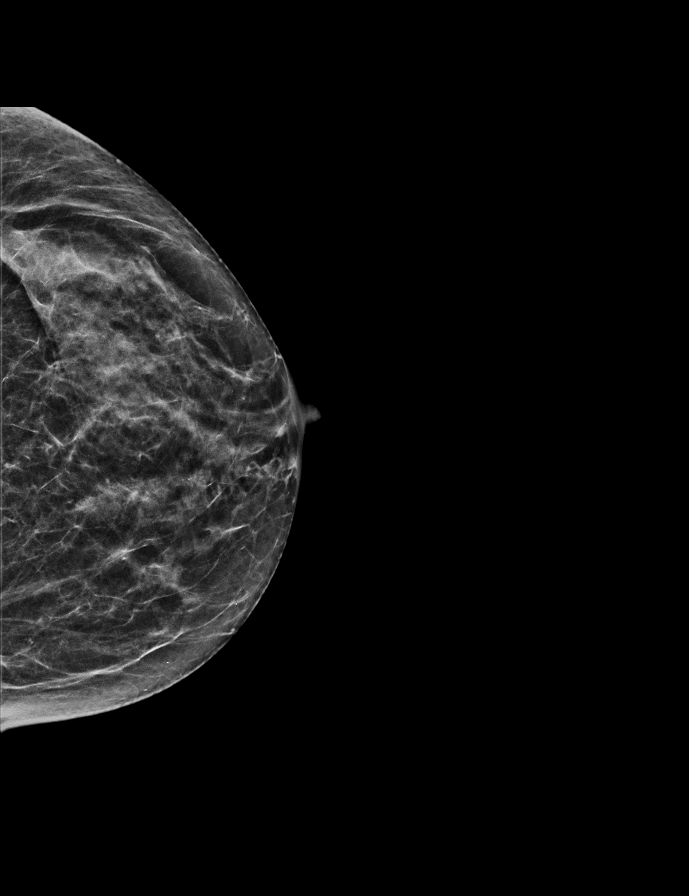

[L MLO synth-2D]
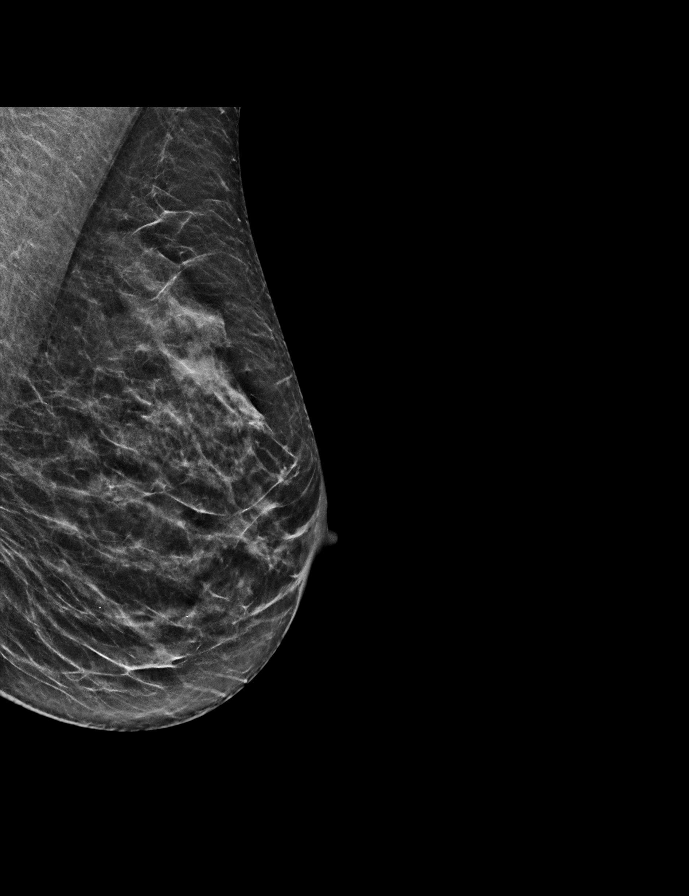

[R CC synth-2D]
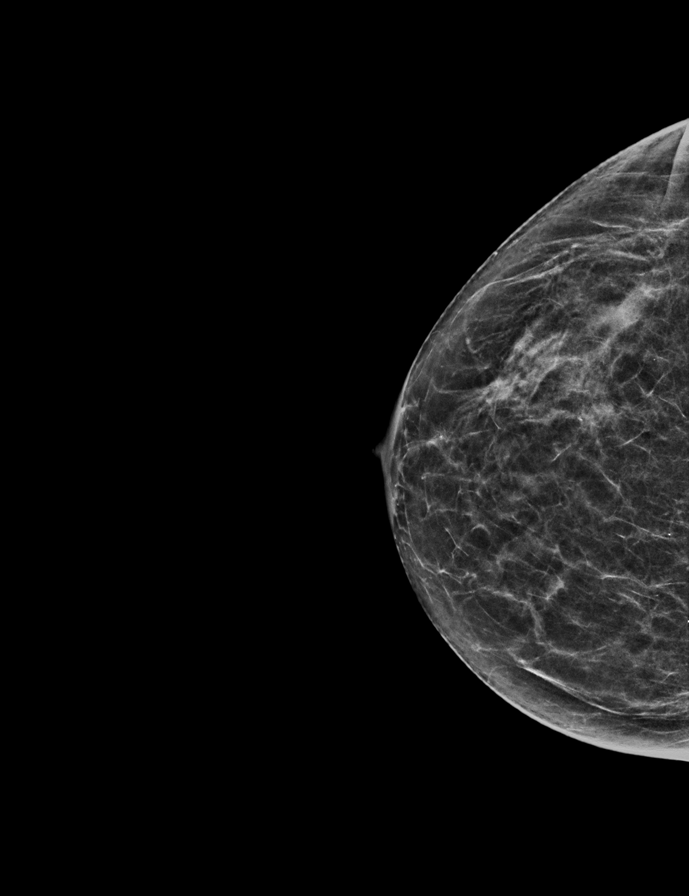

[L CC tomo · 2 of 48 frames shown]
[frame 16/48]
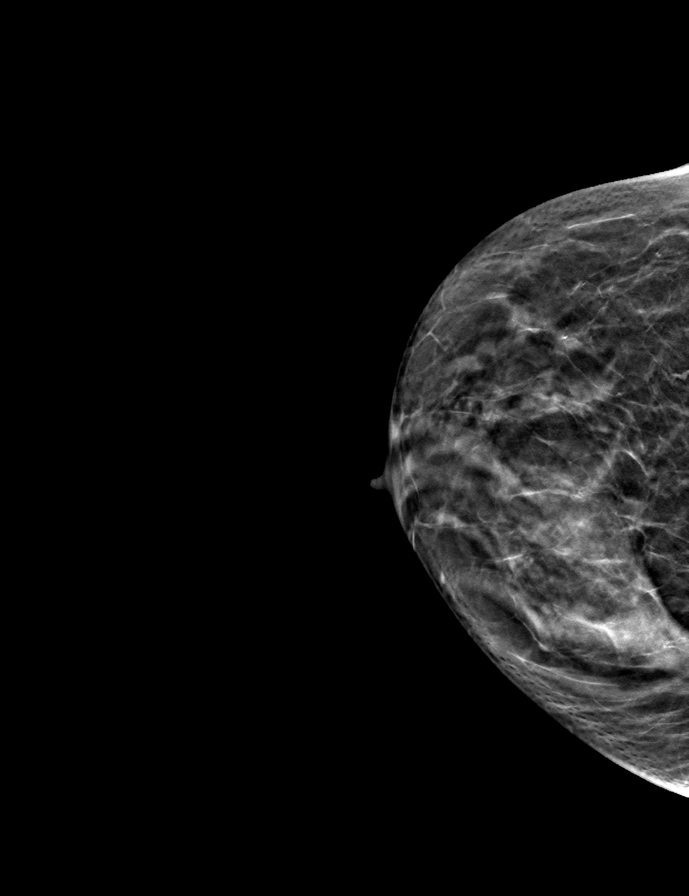
[frame 25/48]
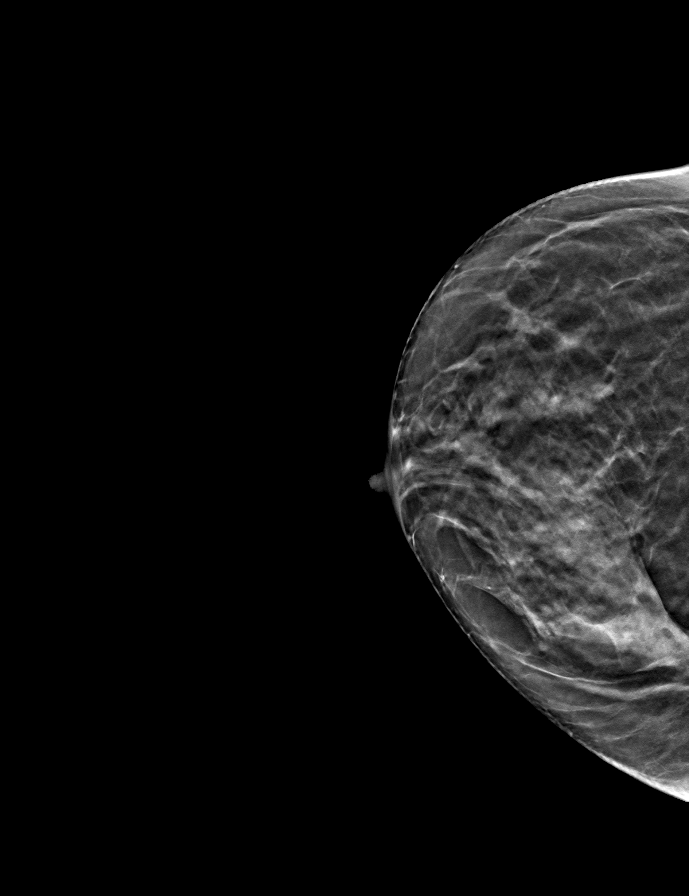

[R CC tomo · tomo slice 25/49.0]
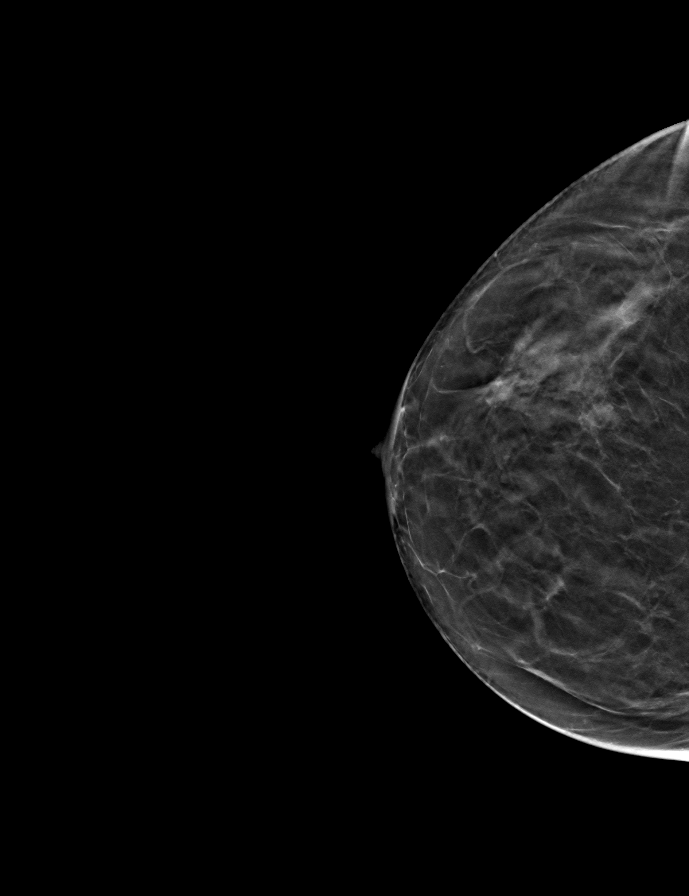

[L MLO tomo · tomo slice 25/48.0]
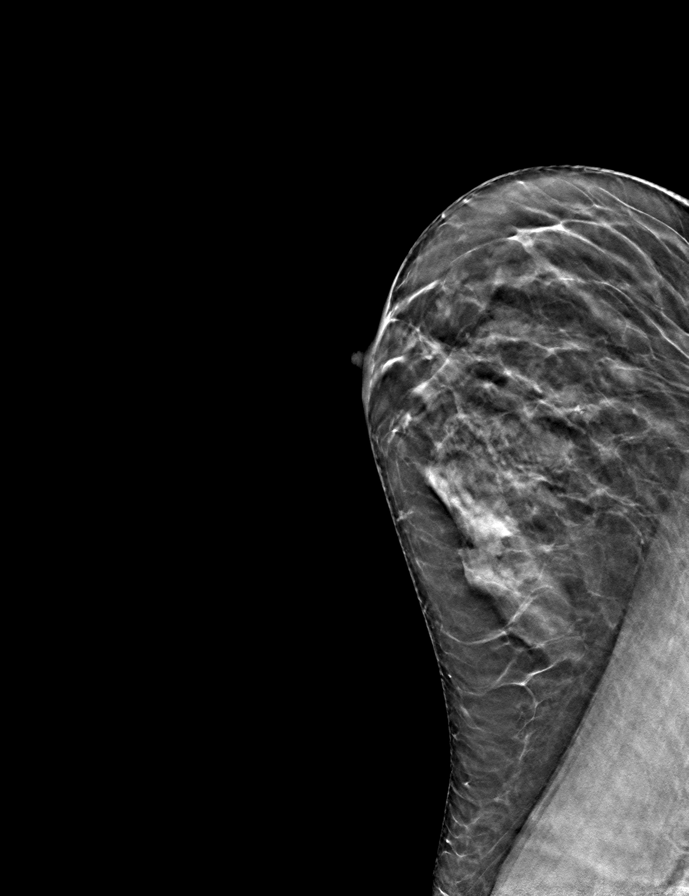

[R MLO tomo · tomo slice 24/47.0]
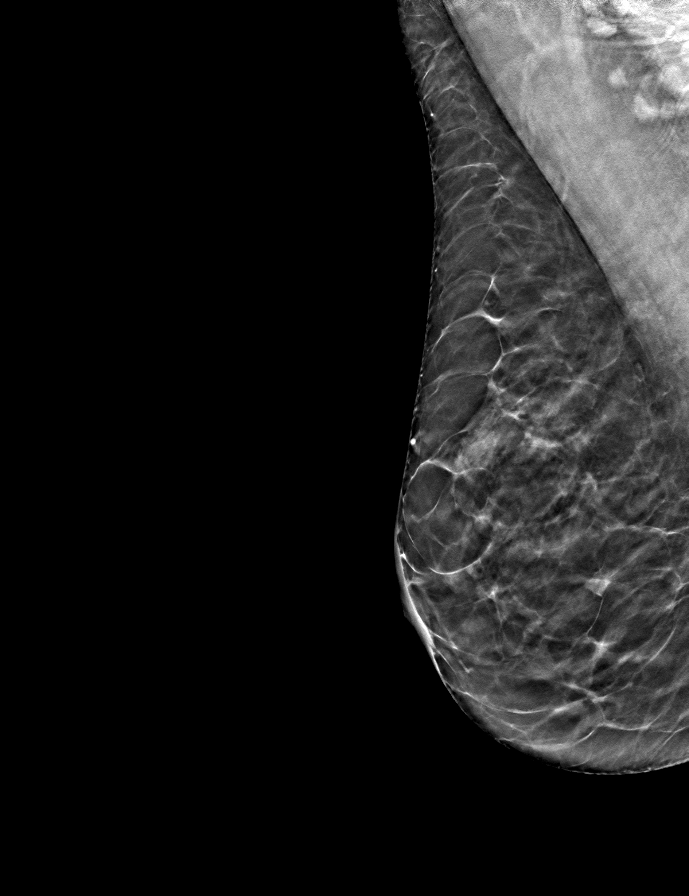

[9 of 24 positions shown; findings below may reference images not displayed]

ACR Breast Density Category c: The breast tissue is heterogeneously
dense, which may obscure small masses.
FINDINGS: There are no findings suspicious for malignancy.
IMPRESSION: No mammographic evidence of malignancy. A result letter of this
screening mammogram will be mailed directly to the patient.

RECOMMENDATION:
Screening mammogram in one year. (Code:Q3-W-BC3)

BI-RADS CATEGORY  1: Negative.

## 2023-02-20 DIAGNOSIS — M25561 Pain in right knee: Secondary | ICD-10-CM | POA: Diagnosis not present

## 2023-04-11 DIAGNOSIS — M25561 Pain in right knee: Secondary | ICD-10-CM | POA: Diagnosis not present

## 2023-04-18 DIAGNOSIS — M6281 Muscle weakness (generalized): Secondary | ICD-10-CM | POA: Diagnosis not present

## 2023-04-18 DIAGNOSIS — M222X1 Patellofemoral disorders, right knee: Secondary | ICD-10-CM | POA: Diagnosis not present

## 2023-04-26 NOTE — Progress Notes (Unsigned)
[facility] [address] [place] [address] Phone: [phone] Subjective:   Bruce Donath, am serving as a scribe for Dr. Antoine Primas.  I'm seeing this patient by the request  of:  Rick Duff, PA-C  CC: Right knee pain  [id number]  Laurie Parsons is a 57 y.o. female coming in with complaint of R knee pain. Patient states that in March she woke up and her knee was swollen. MRI scheduled for Monday through her ortho. Pain in back of knee but started inferior to her patella. Patient has had knee aspirated 2x. Xrays did not show much arthritis other than underneath the R patella and some bone spurring. Now experiencing some popping in back of knee. Is avid Engineer, structural. Has been doing some PT exercises at home for past week and will see her PT at [facility] next Friday. 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   Chronic    Past Surgical History:  Procedure Laterality Date   COLONOSCOPY WITH PROPOFOL     DILATATION & CURRETTAGE/HYSTEROSCOPY WITH RESECTOCOPE N/A 08/17/2015   Procedure: DILATATION & CURETTAGE/HYSTEROSCOPY;  Surgeon: Jerene Bears, MD;  Location: [facility];  Service: Gynecology;  Laterality: N/A;   LAPAROSCOPY  1992   due to endometriosis - Lupron x 6 months   Social History   Socioeconomic History   Marital status: Married    Spouse name: Not on file   Number of children: Not on file   Years of education: Not on file   Highest education level: Not on file  Occupational History   Not on file  Tobacco Use   Smoking  status: Never   Smokeless tobacco: Never  Vaping Use   Vaping Use: Never used  Substance and Sexual Activity   Alcohol use: Yes    Alcohol/week: 2.0 standard drinks of alcohol    Types: 1 Cans of beer, 1 Shots of liquor per week   Drug use: No   Sexual activity: Not Currently    Partners: Male    Birth control/protection: Post-menopausal  Other Topics Concern   Not on file  Social History Narrative   Not on file   Social Determinants of Health   Financial Resource Strain: Not on file  Food Insecurity: Not on file  Transportation Needs: Not on file  Physical Activity: Not on file  Stress: Not on file  Social Connections: Not on file   Allergies  Allergen Reactions   Cetirizine Hives   Fexofenadine Anxiety and Hives   Levofloxacin Hives   Nitrofurantoin Monohyd Macro Hives   Antihistamines, Diphenhydramine-Type Anxiety   Other Anxiety    Anti Histamines   Family History  Problem Relation Age of Onset   Depression Father    Anxiety disorder Sister    Hypertension Paternal Grandfather    Heart attack Paternal Grandfather    Colon cancer Neg Hx    Esophageal cancer Neg Hx    Stomach cancer Neg Hx  Rectal cancer Neg Hx          Current Outpatient Medications (Other):    Ascorbic Acid (VITAMIN C PO), Take by mouth.   b complex vitamins capsule,    Cholecalciferol (VITAMIN D3) 25 MCG (1000 UT) CAPS,    Multiple Vitamins-Minerals (ZINC PO), Take by mouth.   Omega-3 Fatty Acids (FISH OIL PO), Take 1 capsule by mouth daily.    Probiotic Product (PROBIOTIC PO), Take by mouth.   Reviewed prior external information including notes and imaging from  primary care provider As well as notes that were available from care everywhere and other healthcare systems.  Past medical history, social, surgical and family history all reviewed in electronic medical record.  No pertanent information unless stated regarding to the chief complaint.   Review of Systems:  No  headache, visual changes, nausea, vomiting, diarrhea, constipation, dizziness, abdominal pain, skin rash, fevers, chills, night sweats, weight loss, swollen lymph nodes, body aches, joint swelling, chest pain, shortness of breath, mood changes. POSITIVE muscle aches  Objective  Blood pressure 96/68, pulse 66, height 5\' 4"  (1.626 m), weight 142 lb (64.4 kg), last menstrual period 11/21/2015, SpO2 95 %.   General: No apparent distress alert and oriented x3 mood and affect normal, dressed appropriately.  HEENT: Pupils equal, extraocular movements intact  Respiratory: Patient's speak in full sentences and does not appear short of breath  Cardiovascular: No lower extremity edema, non tender, no erythema  Right knee exam does have trace effusion noted.  Patient lacks last 5 degrees of flexion of the knee.  Mild crepitus noted.  Tender to palpation laterally over the medial joint line.  Mild discomfort noted with McMurray's.  Limited muscular skeletal ultrasound was performed and interpreted by Antoine Primas, M  Limited ultrasound does show the patient does have hypoechoic changes noted mostly of the medial meniscus with some calcific changes noted.  Could be potentially gouty deposit is noted.  Does have some displacement of 10 to 25% of the posterior medial meniscus.  Hypoechoic changes consistent with a small effusion of the patellofemoral joint.  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

## 2023-04-27 ENCOUNTER — Encounter: Payer: Self-pay | Admitting: Family Medicine

## 2023-04-27 ENCOUNTER — Other Ambulatory Visit: Payer: Self-pay

## 2023-04-27 ENCOUNTER — Ambulatory Visit: Payer: 59 | Admitting: Family Medicine

## 2023-04-27 VITALS — BP 96/68 | HR 66 | Ht 64.0 in | Wt 142.0 lb

## 2023-04-27 DIAGNOSIS — M25561 Pain in right knee: Secondary | ICD-10-CM

## 2023-04-27 DIAGNOSIS — M25562 Pain in left knee: Secondary | ICD-10-CM

## 2023-04-27 NOTE — Patient Instructions (Signed)
Get the MRI  If you can get a CD drop it off here for me to review  2,400 tart cherry at night  Read about PRP

## 2023-04-27 NOTE — Assessment & Plan Note (Addendum)
Patient is having pain in the right knee.  Has failed all formal physical therapy at this time, multiple aspirations of the knee, but continuing to have discomfort and pain.  Increase activity slowly.  Patient is scheduled for an MRI which I think will give [place] more understanding.  Depending on this we will see if patient is a candidate for MRI, viscosupplementation reveals the arthritic changes or consider the possibility of surgical intervention if needed

## 2023-04-29 DIAGNOSIS — M25561 Pain in right knee: Secondary | ICD-10-CM | POA: Diagnosis not present

## 2023-05-02 ENCOUNTER — Telehealth: Payer: Self-pay | Admitting: Family Medicine

## 2023-05-02 NOTE — Telephone Encounter (Signed)
Patient dropped off an MRI disc yesterday to be reviewed by Dr Katrinka Blazing. Patient called to follow up to check status on Dr Michaelle Copas thoughts?  Please advise.

## 2023-05-02 NOTE — Telephone Encounter (Signed)
Sent patient MyChart message with recommendations.  

## 2023-05-05 DIAGNOSIS — M6281 Muscle weakness (generalized): Secondary | ICD-10-CM | POA: Diagnosis not present

## 2023-05-05 DIAGNOSIS — M222X1 Patellofemoral disorders, right knee: Secondary | ICD-10-CM | POA: Diagnosis not present

## 2023-05-12 DIAGNOSIS — M222X1 Patellofemoral disorders, right knee: Secondary | ICD-10-CM | POA: Diagnosis not present

## 2023-05-19 DIAGNOSIS — M6281 Muscle weakness (generalized): Secondary | ICD-10-CM | POA: Diagnosis not present

## 2023-05-19 DIAGNOSIS — M222X1 Patellofemoral disorders, right knee: Secondary | ICD-10-CM | POA: Diagnosis not present

## 2023-05-24 DIAGNOSIS — M1711 Unilateral primary osteoarthritis, right knee: Secondary | ICD-10-CM | POA: Diagnosis not present

## 2023-07-20 DIAGNOSIS — M25561 Pain in right knee: Secondary | ICD-10-CM | POA: Diagnosis not present

## 2023-10-10 DIAGNOSIS — M25562 Pain in left knee: Secondary | ICD-10-CM | POA: Diagnosis not present

## 2023-10-10 DIAGNOSIS — S76312A Strain of muscle, fascia and tendon of the posterior muscle group at thigh level, left thigh, initial encounter: Secondary | ICD-10-CM | POA: Diagnosis not present

## 2023-10-24 DIAGNOSIS — M25572 Pain in left ankle and joints of left foot: Secondary | ICD-10-CM | POA: Diagnosis not present

## 2023-10-24 DIAGNOSIS — M25562 Pain in left knee: Secondary | ICD-10-CM | POA: Diagnosis not present

## 2023-11-02 ENCOUNTER — Other Ambulatory Visit (HOSPITAL_BASED_OUTPATIENT_CLINIC_OR_DEPARTMENT_OTHER): Payer: Self-pay | Admitting: Physician Assistant

## 2023-11-02 DIAGNOSIS — Z1231 Encounter for screening mammogram for malignant neoplasm of breast: Secondary | ICD-10-CM

## 2023-11-03 DIAGNOSIS — S76312D Strain of muscle, fascia and tendon of the posterior muscle group at thigh level, left thigh, subsequent encounter: Secondary | ICD-10-CM | POA: Diagnosis not present

## 2023-11-03 DIAGNOSIS — M7672 Peroneal tendinitis, left leg: Secondary | ICD-10-CM | POA: Diagnosis not present

## 2023-11-03 DIAGNOSIS — M6281 Muscle weakness (generalized): Secondary | ICD-10-CM | POA: Diagnosis not present

## 2023-11-14 ENCOUNTER — Encounter (HOSPITAL_BASED_OUTPATIENT_CLINIC_OR_DEPARTMENT_OTHER): Payer: Self-pay | Admitting: Obstetrics & Gynecology

## 2023-11-14 ENCOUNTER — Ambulatory Visit (INDEPENDENT_AMBULATORY_CARE_PROVIDER_SITE_OTHER): Payer: 59 | Admitting: Obstetrics & Gynecology

## 2023-11-14 VITALS — BP 114/73 | HR 78 | Ht 61.0 in | Wt 144.0 lb

## 2023-11-14 DIAGNOSIS — N951 Menopausal and female climacteric states: Secondary | ICD-10-CM

## 2023-11-14 DIAGNOSIS — R7989 Other specified abnormal findings of blood chemistry: Secondary | ICD-10-CM

## 2023-11-14 DIAGNOSIS — Z6827 Body mass index (BMI) 27.0-27.9, adult: Secondary | ICD-10-CM

## 2023-11-14 DIAGNOSIS — Z01419 Encounter for gynecological examination (general) (routine) without abnormal findings: Secondary | ICD-10-CM | POA: Diagnosis not present

## 2023-11-14 MED ORDER — PROGESTERONE MICRONIZED 100 MG PO CAPS: ORAL_CAPSULE | ORAL | 2 refills | Status: DC

## 2023-11-14 MED ORDER — ESTRADIOL 0.025 MG/24HR TD PTTW: 1.0000 | MEDICATED_PATCH | TRANSDERMAL | 2 refills | Status: DC

## 2023-11-14 NOTE — Progress Notes (Signed)
57 y.o. G0P0000 Married White or Caucasian female here for annual exam.  Doing well.  Denies vaginal bleeding.   Having issues with hot flashes, sleep disturbance, joint pain that feels related to menopause.  Last cycle was in late 2016.  Wants to talk about HRT.  Options discussed.  Risks reviewed including DVT/PE, Stroke, MI.  She is desirous of starting.   Had right knee swelling after Covid.  Evaluation was all normal.  This is improved with PT.  Has seen Dr. Lewie Loron and had imaging.    Patient's last menstrual period was 11/21/2015.          Sexually active: No.   Health Maintenance: Pap:  11/08/2022 Negative History of abnormal Pap:  no MMG:  11/14/2022 Negative Colonoscopy:  11/05/2021, follow up 10 BMD:   not indicated Screening Labs: lab work ordered for Friday   reports that she has never smoked. She has never used smokeless tobacco. She reports current alcohol use of about 2.0 standard drinks of alcohol per week. She reports that she does not use drugs.  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   Chronic     Past Surgical History:  Procedure Laterality Date   COLONOSCOPY WITH PROPOFOL     DILATATION & CURRETTAGE/HYSTEROSCOPY WITH RESECTOCOPE N/A 08/17/2015   Procedure: DILATATION & CURETTAGE/HYSTEROSCOPY;  Surgeon: Jerene Bears, MD;  Location: [facility];  Service: Gynecology;  Laterality: N/A;   LAPAROSCOPY  1992   due to endometriosis - Lupron x 6 months    Current Outpatient Medications  Medication Sig Dispense Refill   Ascorbic Acid (VITAMIN C PO) Take by mouth.     b complex vitamins capsule      Cholecalciferol (VITAMIN D3) 25 MCG (1000 UT) CAPS      Multiple  Vitamins-Minerals (ZINC PO) Take by mouth.     Omega-3 Fatty Acids (FISH OIL PO) Take 1 capsule by mouth daily.      Probiotic Product (PROBIOTIC PO) Take by mouth.     No current facility-administered medications for this visit.    Family History  Problem Relation Age of Onset   Depression Father    Anxiety disorder Sister    Hypertension Paternal Grandfather    Heart attack Paternal Grandfather    Colon cancer Neg Hx    Esophageal cancer Neg Hx    Stomach cancer Neg Hx    Rectal cancer Neg Hx     ROS: Constitutional: negative Genitourinary:negative  Exam:   BP 114/73 (BP Location: Right Arm, Patient Position: Sitting, Cuff Size: Normal)   Pulse 78   Ht 5\' 1"  (1.549 m)   Wt 144 lb (65.3 kg)   LMP 11/21/2015   BMI 27.21 kg/m   Height: 5\' 1"  (154.9 cm)  General appearance: alert, cooperative and appears stated age Head: Normocephalic, without obvious abnormality, atraumatic Neck: no adenopathy, supple, symmetrical, trachea midline and thyroid normal to inspection and palpation Lungs: clear to auscultation bilaterally Breasts: normal appearance, no masses or tenderness Heart: regular rate and rhythm Abdomen: soft, non-tender; bowel sounds normal; no masses,  no organomegaly Extremities: extremities normal, atraumatic, no  cyanosis or edema Skin: Skin color, texture, turgor normal. No rashes or lesions Lymph nodes: Cervical, supraclavicular, and axillary nodes normal. No abnormal inguinal nodes palpated Neurologic: Grossly normal   Pelvic: External genitalia:  no lesions              Urethra:  normal appearing urethra with no masses, tenderness or lesions              Bartholins and Skenes: normal                 Vagina: atrophic vaginal mucosa,no discharge, no lesions              Cervix: no lesions              Pap taken: No. Bimanual Exam:  Uterus:  normal size, contour, position, consistency, mobility, non-tender              Adnexa: normal adnexa and no mass,  fullness, tenderness               Rectovaginal: Confirms               Anus:  normal sphincter tone, no lesions  Chaperone, Ina Homes, CMA, was present for exam.  Assessment/Plan: 1. Well woman exam with routine gynecological exam (Primary) - Pap smear 2023 negative with neg HR HPV - Mammogram scheduled 11/17/2023 - Colonoscopy 10/2021, follow up 10 years - lab work ordered.  She will return for this on Friday - vaccines reviewed/updated   2. Vasomotor symptoms due to menopause - estradiol (VIVELLE-DOT) 0.025 MG/24HR; Place 1 patch onto the skin 2 (two) times a week.  Dispense: 8 patch; Refill: 2 - progesterone (PROMETRIUM) 100 MG capsule; Take 1 capsule days days 1 - 14  Dispense: 14 capsule; Refill: 2  3. Elevated serum creatinine - Lipid panel; Future  4. BMI 27.0-27.9,adult - Comprehensive metabolic panel; Future - Hemoglobin A1c; Future - TSH; Future

## 2023-11-16 ENCOUNTER — Ambulatory Visit (HOSPITAL_BASED_OUTPATIENT_CLINIC_OR_DEPARTMENT_OTHER): Payer: 59 | Admitting: Obstetrics & Gynecology

## 2023-11-16 DIAGNOSIS — S76312D Strain of muscle, fascia and tendon of the posterior muscle group at thigh level, left thigh, subsequent encounter: Secondary | ICD-10-CM | POA: Diagnosis not present

## 2023-11-16 DIAGNOSIS — M7672 Peroneal tendinitis, left leg: Secondary | ICD-10-CM | POA: Diagnosis not present

## 2023-11-16 DIAGNOSIS — M6281 Muscle weakness (generalized): Secondary | ICD-10-CM | POA: Diagnosis not present

## 2023-11-17 ENCOUNTER — Other Ambulatory Visit (HOSPITAL_BASED_OUTPATIENT_CLINIC_OR_DEPARTMENT_OTHER): Payer: 59

## 2023-11-17 ENCOUNTER — Encounter (HOSPITAL_BASED_OUTPATIENT_CLINIC_OR_DEPARTMENT_OTHER): Payer: Self-pay | Admitting: Radiology

## 2023-11-17 ENCOUNTER — Ambulatory Visit (HOSPITAL_BASED_OUTPATIENT_CLINIC_OR_DEPARTMENT_OTHER)
Admission: RE | Admit: 2023-11-17 | Discharge: 2023-11-17 | Disposition: A | Discharge status: Home / Self Care | Payer: 59 | Source: Ambulatory Visit | Attending: Physician Assistant | Admitting: Physician Assistant

## 2023-11-17 DIAGNOSIS — R7989 Other specified abnormal findings of blood chemistry: Secondary | ICD-10-CM | POA: Diagnosis not present

## 2023-11-17 DIAGNOSIS — Z1231 Encounter for screening mammogram for malignant neoplasm of breast: Secondary | ICD-10-CM | POA: Insufficient documentation

## 2023-11-17 DIAGNOSIS — Z6827 Body mass index (BMI) 27.0-27.9, adult: Secondary | ICD-10-CM

## 2023-11-18 LAB — COMPREHENSIVE METABOLIC PANEL
ALT: 17 [IU]/L (ref 0–32)
AST: 21 [IU]/L (ref 0–40)
Albumin: 4.1 g/dL (ref 3.8–4.9)
Alkaline Phosphatase: 84 [IU]/L (ref 44–121)
BUN/Creatinine Ratio: 12 (ref 9–23)
BUN: 11 mg/dL (ref 6–24)
Bilirubin Total: 0.6 mg/dL (ref 0.0–1.2)
CO2: 24 mmol/L (ref 20–29)
Calcium: 9.4 mg/dL (ref 8.7–10.2)
Chloride: 102 mmol/L (ref 96–106)
Creatinine, Ser: 0.91 mg/dL (ref 0.57–1.00)
Globulin, Total: 2 g/dL (ref 1.5–4.5)
Glucose: 90 mg/dL (ref 70–99)
Potassium: 4.5 mmol/L (ref 3.5–5.2)
Sodium: 139 mmol/L (ref 134–144)
Total Protein: 6.1 g/dL (ref 6.0–8.5)
eGFR: 74 mL/min/{1.73_m2} (ref >59)

## 2023-11-18 LAB — LIPID PANEL
Chol/HDL Ratio: 2 {ratio} (ref 0.0–4.4)
Cholesterol, Total: 242 mg/dL — ABNORMAL HIGH (ref 100–199)
HDL: 122 mg/dL (ref >39)
LDL Chol Calc (NIH): 113 mg/dL — ABNORMAL HIGH (ref 0–99)
Triglycerides: 44 mg/dL (ref 0–149)
VLDL Cholesterol Cal: 7 mg/dL (ref 5–40)

## 2023-11-18 LAB — TSH: TSH: 1.01 u[IU]/mL (ref 0.450–4.500)

## 2023-11-18 LAB — HEMOGLOBIN A1C
Est. average glucose Bld gHb Est-mCnc: 111 mg/dL
Hgb A1c MFr Bld: 5.5 % (ref 4.8–5.6)

## 2024-01-22 ENCOUNTER — Encounter (HOSPITAL_BASED_OUTPATIENT_CLINIC_OR_DEPARTMENT_OTHER): Payer: Self-pay | Admitting: Obstetrics & Gynecology

## 2024-02-07 ENCOUNTER — Other Ambulatory Visit (HOSPITAL_BASED_OUTPATIENT_CLINIC_OR_DEPARTMENT_OTHER): Payer: Self-pay | Admitting: Obstetrics & Gynecology

## 2024-02-07 DIAGNOSIS — N951 Menopausal and female climacteric states: Secondary | ICD-10-CM

## 2024-02-09 NOTE — Telephone Encounter (Signed)
 LMOVM for pt to call regarding refill. Is she still taking?

## 2024-03-18 DIAGNOSIS — H43812 Vitreous degeneration, left eye: Secondary | ICD-10-CM | POA: Diagnosis not present

## 2024-04-02 DIAGNOSIS — M25561 Pain in right knee: Secondary | ICD-10-CM | POA: Diagnosis not present

## 2024-04-29 DIAGNOSIS — H43812 Vitreous degeneration, left eye: Secondary | ICD-10-CM | POA: Diagnosis not present

## 2024-04-30 DIAGNOSIS — M6281 Muscle weakness (generalized): Secondary | ICD-10-CM | POA: Diagnosis not present

## 2024-04-30 DIAGNOSIS — R2689 Other abnormalities of gait and mobility: Secondary | ICD-10-CM | POA: Diagnosis not present

## 2024-04-30 DIAGNOSIS — S335XXD Sprain of ligaments of lumbar spine, subsequent encounter: Secondary | ICD-10-CM | POA: Diagnosis not present

## 2024-05-04 DIAGNOSIS — M21621 Bunionette of right foot: Secondary | ICD-10-CM | POA: Diagnosis not present

## 2024-05-13 DIAGNOSIS — S335XXD Sprain of ligaments of lumbar spine, subsequent encounter: Secondary | ICD-10-CM | POA: Diagnosis not present

## 2024-05-13 DIAGNOSIS — M6281 Muscle weakness (generalized): Secondary | ICD-10-CM | POA: Diagnosis not present

## 2024-05-13 DIAGNOSIS — R2689 Other abnormalities of gait and mobility: Secondary | ICD-10-CM | POA: Diagnosis not present

## 2024-05-28 DIAGNOSIS — H43813 Vitreous degeneration, bilateral: Secondary | ICD-10-CM | POA: Diagnosis not present

## 2024-07-15 DIAGNOSIS — R195 Other fecal abnormalities: Secondary | ICD-10-CM | POA: Diagnosis not present

## 2024-08-12 ENCOUNTER — Ambulatory Visit: Admitting: Gastroenterology

## 2024-08-12 ENCOUNTER — Other Ambulatory Visit (INDEPENDENT_AMBULATORY_CARE_PROVIDER_SITE_OTHER)

## 2024-08-12 ENCOUNTER — Encounter: Payer: Self-pay | Admitting: Gastroenterology

## 2024-08-12 VITALS — BP 110/76 | HR 57 | Ht 61.0 in | Wt 150.2 lb

## 2024-08-12 DIAGNOSIS — D696 Thrombocytopenia, unspecified: Secondary | ICD-10-CM | POA: Diagnosis not present

## 2024-08-12 DIAGNOSIS — R194 Change in bowel habit: Secondary | ICD-10-CM | POA: Diagnosis not present

## 2024-08-12 DIAGNOSIS — R1012 Left upper quadrant pain: Secondary | ICD-10-CM

## 2024-08-12 DIAGNOSIS — R195 Other fecal abnormalities: Secondary | ICD-10-CM

## 2024-08-12 LAB — PROTIME-INR
INR: 1 ratio (ref 0.8–1.0)
Prothrombin Time: 10.6 s (ref 9.6–13.1)

## 2024-08-12 LAB — CBC WITH DIFFERENTIAL/PLATELET
Basophils Absolute: 0 K/uL (ref 0.0–0.1)
Basophils Relative: 0.5 % (ref 0.0–3.0)
Eosinophils Absolute: 0.1 K/uL (ref 0.0–0.7)
Eosinophils Relative: 1.4 % (ref 0.0–5.0)
HCT: 44.5 % (ref 36.0–46.0)
Hemoglobin: 14.9 g/dL (ref 12.0–15.0)
Lymphocytes Relative: 37.2 % (ref 12.0–46.0)
Lymphs Abs: 2.8 K/uL (ref 0.7–4.0)
MCHC: 33.4 g/dL (ref 30.0–36.0)
MCV: 90.9 fl (ref 78.0–100.0)
Monocytes Absolute: 0.4 K/uL (ref 0.1–1.0)
Monocytes Relative: 5.1 % (ref 3.0–12.0)
Neutro Abs: 4.3 K/uL (ref 1.4–7.7)
Neutrophils Relative %: 55.8 % (ref 43.0–77.0)
Platelets: 189 K/uL (ref 150.0–400.0)
RBC: 4.9 Mil/uL (ref 3.87–5.11)
RDW: 13.6 % (ref 11.5–15.5)
WBC: 7.7 K/uL (ref 4.0–10.5)

## 2024-08-12 LAB — COMPREHENSIVE METABOLIC PANEL WITH GFR
ALT: 13 U/L (ref 0–35)
AST: 17 U/L (ref 0–37)
Albumin: 4.3 g/dL (ref 3.5–5.2)
Alkaline Phosphatase: 86 U/L (ref 39–117)
BUN: 15 mg/dL (ref 6–23)
CO2: 28 meq/L (ref 19–32)
Calcium: 9.6 mg/dL (ref 8.4–10.5)
Chloride: 105 meq/L (ref 96–112)
Creatinine, Ser: 0.93 mg/dL (ref 0.40–1.20)
GFR: 67.81 mL/min (ref >60.00)
Glucose, Bld: 86 mg/dL (ref 70–99)
Potassium: 4 meq/L (ref 3.5–5.1)
Sodium: 141 meq/L (ref 135–145)
Total Bilirubin: 0.5 mg/dL (ref 0.2–1.2)
Total Protein: 6.7 g/dL (ref 6.0–8.3)

## 2024-08-12 LAB — LIPASE: Lipase: 13 U/L (ref 11.0–59.0)

## 2024-08-12 NOTE — Progress Notes (Signed)
 Chief Complaint:pale stool Primary GI Doctor:Dr. Federico  HPI:  Patient is a  58  year old female patient with past medical history of GERD, anxiety, history of colon polyps, who was self referred to me for a evaluation of pale stool .   Patient last seen by Dr. Federico on 11/03/2021 in GI office for complaints of rectal bleeding.  ED visit 07/15/2024 for clay colored stool. They ordered lab work and urine sample.  Lab work shows: WBC 6.9, hemoglobin 15.4, hemic hematocrit 47.3, platelets 140, UA negative  Interval History    Patient presents for evaluation of changes in bowel habits.  Patient states for the past 4 to 5 months she started taking magnesium glycinate and noticed about 8 weeks ago that her stool was clay colored.  Patient decided to stop the magnesium to see if this would improve her symptoms.  Patient states the clay colored stools have decreased and only happen sometimes.  Patient denies any jaundice or dark urine.  Patient denies poor appetite nausea or vomiting.  Patient denies fever or chills.  Patient reports the stool did not have much formed to it therefore she incorporated more fiber into her diet which seem to help.  She has noticed bright red blood in the toilet paper if she passes firmer stools.  Patient notes mild discomfort in left upper quadrant, no known triggers.  Recent lab work showed thrombocytopenia.  Patient denies history of having thrombocytopenia. She notes recent bruising and petechiae.  Not taking NSAIDs  She very seldom drinks. Nonsmoker.  Wt Readings from Last 3 Encounters:  08/12/24 150 lb 3.2 oz (68.1 kg)  11/14/23 144 lb (65.3 kg)  04/27/23 142 lb (64.4 kg)    Procedures: 11/05/2021 colonoscopy, recall 10 years - Two 3 to 7 mm polyps in the transverse colon and in the ascending colon, removed with a cold snare. Resected and retrieved. - Non- bleeding internal hemorrhoids. Healed anal fissure. Path: Surgical [P], colon, ascending, transverse,  polyp (2) HYPERPLASTIC POLYPS WITH UNDERLYING HYPERPLASTIC LYMPHOID NODULES. NEGATIVE FOR DYSPLASIA.  06/17/2016 colonoscopy 1 diminutive polyp in the proximal transverse colon The Examined portion of the ileum was normal Small internal hemorrhoids The exam was otherwise normal Path: Colon transverse polyp Benign lymphoid polyp   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   Chronic     Past Surgical History:  Procedure Laterality Date   COLONOSCOPY WITH PROPOFOL      DILATATION & CURRETTAGE/HYSTEROSCOPY WITH RESECTOCOPE N/A 08/17/2015   Procedure: DILATATION & CURETTAGE/HYSTEROSCOPY;  Surgeon: Ronal GORMAN Pinal, MD;  Location: [facility];  Service: Gynecology;  Laterality: N/A;   LAPAROSCOPY  1992   due to endometriosis - Lupron x 6 months    Current Outpatient Medications  Medication Sig Dispense Refill   Ascorbic Acid (VITAMIN C PO) Take by mouth.     b complex vitamins capsule      Cholecalciferol (VITAMIN D3) 25 MCG (1000 UT) CAPS  (Patient taking differently: 3,000 Units daily. With K2)     Multiple Vitamins-Minerals (ZINC PO) Take by mouth.     Omega-3 Fatty Acids (FISH OIL PO) Take  1 capsule by mouth daily.      Probiotic Product (PROBIOTIC PO) Take by mouth.     No current facility-administered medications for this visit.    Allergies as of 08/12/2024 - Review Complete 08/12/2024  Allergen Reaction Noted   Fexofenadine Anxiety, Hives, Anaphylaxis, and Swelling 02/22/2012   Cetirizine Hives 02/22/2012   Nitrofurantoin monohyd macro Hives 02/13/2012   Antihistamines, diphenhydramine -type Anxiety 05/22/2015   Levofloxacin Hives and Dermatitis 10/09/2010   Nitrofurantoin  Dermatitis and Hives 02/13/2012   Other Anxiety 02/04/2014    Family History  Problem Relation Age of Onset   Depression Father    Anxiety disorder Sister    Hypertension Paternal Grandfather    Heart attack Paternal Grandfather    Colon cancer Neg Hx    Esophageal cancer Neg Hx    Stomach cancer Neg Hx    Rectal cancer Neg Hx     Review of Systems:    Constitutional: No weight loss, fever, chills, weakness or fatigue HEENT: Eyes: No change in vision               Ears, Nose, Throat:  No change in hearing or congestion Skin: No rash or itching Cardiovascular: No chest pain, chest pressure or palpitations   Respiratory: No SOB or cough Gastrointestinal: See HPI and otherwise negative Genitourinary: No dysuria or change in urinary frequency Neurological: No headache, dizziness or syncope Musculoskeletal: No new muscle or joint pain Hematologic: No bleeding or bruising Psychiatric: No history of depression or anxiety    Physical Exam:  Vital signs: BP 110/76   Pulse (!) 57   Ht 5' 1 (1.549 m)   Wt 150 lb 3.2 oz (68.1 kg)   LMP 11/21/2015   BMI 28.38 kg/m   Constitutional:   Pleasant  female appears to be in NAD, Well developed, Well nourished, alert and cooperative Throat: Oral cavity and pharynx without inflammation, swelling or lesion.  Respiratory: Respirations even and unlabored. Lungs clear to auscultation bilaterally.   No wheezes, crackles, or rhonchi.  Cardiovascular: Normal S1, S2. Regular rate and rhythm. No peripheral edema, cyanosis or pallor.  Gastrointestinal:  Soft, nondistended, nontender. No rebound or guarding. hypoactive bowel sounds. No appreciable masses or hepatomegaly. Rectal:  Not performed.  Msk:  Symmetrical without gross deformities. Without edema, no deformity or joint abnormality.  Neurologic:  Alert and  oriented x4;  grossly normal neurologically.  Skin:   Dry and intact without significant lesions or rashes.  RELEVANT LABS AND  IMAGING: CBC    Latest Ref Rng & Units 11/03/2021   11:11 AM 06/10/2020    2:34 PM 06/03/2019    8:45 AM  CBC  WBC 4.0 - 10.5 K/uL 5.6  8.3  6.4   Hemoglobin 12.0 - 15.0 g/dL 85.2  85.3  84.0   Hematocrit 36.0 - 46.0 % 44.1  44.1  48.3   Platelets 150.0 - 400.0 K/uL 162.0  170  179      CMP     Latest Ref Rng & Units 11/17/2023    9:45 AM 11/10/2022    9:45 AM 09/07/2021    9:46 AM  CMP  Glucose 70 - 99 mg/dL 90  85  90   BUN 6 - 24 mg/dL 11  16  15    Creatinine 0.57 - 1.00 mg/dL 9.08  9.03  8.96   Sodium 134 - 144 mmol/L 139  140  138   Potassium 3.5 - 5.2 mmol/L 4.5  4.2  4.8  Chloride 96 - 106 mmol/L 102  101  100   CO2 20 - 29 mmol/L 24  24  23    Calcium 8.7 - 10.2 mg/dL 9.4  9.8  9.8   Total Protein 6.0 - 8.5 g/dL 6.1  6.6  6.7   Total Bilirubin 0.0 - 1.2 mg/dL 0.6  0.7  0.4   Alkaline Phos 44 - 121 IU/L 84  86  92   AST 0 - 40 IU/L 21  20  21    ALT 0 - 32 IU/L 17  15  18       Lab Results  Component Value Date   TSH 1.010 11/17/2023     Assessment: Encounter Diagnoses  Name Primary?   Altered bowel habits Yes   LUQ pain    Thrombocytopenia (HCC)     58 year old female patient that presents with pale colored stools with LUQ pain for past few weeks.  Patient recently seen in urgent care where they did lab work and UA.  Lab work was unremarkable except low platelet count.  Will go ahead and recheck levels today along with lipase and clotting factors.  Would also like to proceed with complete abdominal ultrasound to rule out acute cause.  Advised patient stay off of museum supplement.     We discussed the bright red blood with wiping most likely due to internal hemorrhoids with straining.  Recommend patient follow high-fiber diet. UTD on colon screening.   Plan: - CBC, CMP, lipase, PT/INT, PTT -complete abdominal ultrasound  -hold magnesium glycinate supplement   Thank you for the courtesy of this consult. Please call me with any questions or concerns.   Avey Mcmanamon, FNP-C [facility] 08/12/2024, 4:55 PM  Cc: Nena Rosina CROME, PA-C

## 2024-08-12 NOTE — Patient Instructions (Signed)
 Your provider has requested that you go to the basement level for lab work before leaving today. Press B on the elevator. The lab is located at the first door on the left as you exit the elevator.  Bowel changes Hold magnesium glycinate for now Please do not start any new herbal supplements for now Recommend high fiber diet  You have been scheduled for an abdominal ultrasound at [facility] (1st floor of hospital) on 08/14/24 at 9:00 am. Please arrive 30 minutes prior to your appointment for registration. Make certain not to have anything to eat or drink 6 hours prior to your appointment. Should you need to reschedule your appointment, please contact radiology at [phone]. This test typically takes about 30 minutes to perform.  Please follow up sooner if symptoms increase or worsen  Due to recent changes in healthcare laws, you may see the results of your imaging and laboratory studies on MyChart before your provider has had a chance to review them.  We understand that in some cases there may be results that are confusing or concerning to you. Not all laboratory results come back in the same time frame and the provider may be waiting for multiple results in order to interpret others.  Please give us  48 hours in order for your provider to thoroughly review all the results before contacting the office for clarification of your results.   Thank you for trusting me with your gastrointestinal care!   Deanna May, NP _______________________________________________________  If your blood pressure at your visit was 140/90 or greater, please contact your primary care physician to follow up on this.  _______________________________________________________  If you are age 58 or older, your body mass index should be between 23-30. Your Body mass index is 28.38 kg/m. If this is out of the aforementioned range listed, please consider follow up with your Primary Care Provider.  If you are  age 43 or younger, your body mass index should be between 19-25. Your Body mass index is 28.38 kg/m. If this is out of the aformentioned range listed, please consider follow up with your Primary Care Provider.   ________________________________________________________  The [facility] providers would like to encourage you to use MYCHART to communicate with providers for non-urgent requests or questions.  Due to long hold times on the telephone, sending your provider a message by [facility] may be a faster and more efficient way to get a response.  Please allow 48 business hours for a response.  Please remember that this is for non-urgent requests.  _______________________________________________________

## 2024-08-13 NOTE — Progress Notes (Signed)
 I agree with the assessment and plan as outlined by Ms. May.

## 2024-08-14 ENCOUNTER — Ambulatory Visit (INDEPENDENT_AMBULATORY_CARE_PROVIDER_SITE_OTHER)

## 2024-08-14 ENCOUNTER — Ambulatory Visit: Payer: Self-pay | Admitting: Gastroenterology

## 2024-08-14 DIAGNOSIS — R194 Change in bowel habit: Secondary | ICD-10-CM

## 2024-08-14 DIAGNOSIS — R109 Unspecified abdominal pain: Secondary | ICD-10-CM | POA: Diagnosis not present

## 2024-08-14 DIAGNOSIS — R1012 Left upper quadrant pain: Secondary | ICD-10-CM

## 2024-08-14 DIAGNOSIS — D696 Thrombocytopenia, unspecified: Secondary | ICD-10-CM

## 2024-11-26 ENCOUNTER — Ambulatory Visit (HOSPITAL_BASED_OUTPATIENT_CLINIC_OR_DEPARTMENT_OTHER): Payer: 59 | Admitting: Obstetrics & Gynecology

## 2024-12-25 ENCOUNTER — Encounter (HOSPITAL_BASED_OUTPATIENT_CLINIC_OR_DEPARTMENT_OTHER): Payer: Self-pay | Admitting: Obstetrics & Gynecology

## 2024-12-25 ENCOUNTER — Ambulatory Visit (INDEPENDENT_AMBULATORY_CARE_PROVIDER_SITE_OTHER): Admitting: Obstetrics & Gynecology

## 2024-12-25 VITALS — BP 106/70 | HR 77 | Wt 153.0 lb

## 2024-12-25 DIAGNOSIS — Z1331 Encounter for screening for depression: Secondary | ICD-10-CM | POA: Diagnosis not present

## 2024-12-25 DIAGNOSIS — Z01419 Encounter for gynecological examination (general) (routine) without abnormal findings: Secondary | ICD-10-CM

## 2024-12-25 DIAGNOSIS — N952 Postmenopausal atrophic vaginitis: Secondary | ICD-10-CM

## 2024-12-25 DIAGNOSIS — Z1231 Encounter for screening mammogram for malignant neoplasm of breast: Secondary | ICD-10-CM

## 2024-12-25 MED ORDER — ESTRADIOL 0.01 % VA CREA: TOPICAL_CREAM | VAGINAL | 6 refills | Status: AC

## 2024-12-25 NOTE — Progress Notes (Signed)
 "  ANNUAL EXAM Patient name: Laurie Parsons MRN [id number]  Date of birth: 11/25/1966 Chief Complaint:   Gynecologic Exam  History of Present Illness:   Laurie Parsons is a 59 y.o. G0P0000 Caucasian female being seen today for a routine annual exam.  Having vaginal dryness.  Using coconut oil.  Did have a change in stool color -- became pale.  Saw GI.  Had blood work done and ultrasound as well.    Tried HRT last year.  Didn't really help with any of her symptoms so she stopped it.    Patient's last menstrual period was 11/21/2015.   Last pap 11/08/2022. Results were: NILM w/ HRHPV negative. H/O abnormal pap: no Last mammogram: 11/17/2023. Results were: normal. Patient is aware that needs to schedule. Family h/o breast cancer: no Last colonoscopy: 11/05/2021. Results were: normal. Family h/o colorectal cancer: no DEXA:   has not done one yet.  Will order for this year.       12/25/2024    1:23 PM 11/14/2023    3:22 PM 11/08/2022    1:57 PM 08/27/2021    8:55 AM 02/23/2017   10:25 AM  Depression screen PHQ 2/9  Decreased Interest 1 0 0 0 0  Down, Depressed, Hopeless 1 0 0 0 0  PHQ - 2 Score 2 0 0 0 0  Altered sleeping 1      Tired, decreased energy 0      Change in appetite 0      Feeling bad or failure about yourself  0      Trouble concentrating 0      Moving slowly or fidgety/restless 0      Suicidal thoughts 0      PHQ-9 Score 3      Difficult doing work/chores Not difficult at all        Review of Systems:   Pertinent items are noted in HPI Denies any urinary or bowel changes.  Denies pelvic pain.   Pertinent History Reviewed:  Reviewed past medical,surgical, social and family history.  Reviewed problem list, medications and allergies. Physical Assessment:   Vitals:   12/25/24 1320  BP: 106/70  Pulse: 77  SpO2: 99%  Weight: 153 lb (69.4 kg)  Body mass index is 28.91 kg/m.        Physical Examination:   General appearance - well appearing,  and in no distress  Mental status - alert, oriented to person, place, and time  Psych:  She has a normal mood and affect  Skin - warm and dry, normal color, no suspicious lesions noted  Chest - effort normal, all lung fields clear to auscultation bilaterally  Heart - normal rate and regular rhythm  Neck:  midline trachea, no thyromegaly or nodules  Breasts - breasts appear normal, no suspicious masses, no skin or nipple changes or  axillary nodes  Abdomen - soft, nontender, nondistended, no masses or organomegaly  Pelvic - VULVA: normal appearing vulva with no masses, tenderness or lesions   VAGINA: normal appearing vagina with normal color and discharge, no lesions   CERVIX: normal appearing cervix without discharge or lesions, no CMT  Thin prep pap is not indicated  UTERUS: uterus is felt to be normal size, shape, consistency and nontender   ADNEXA: No adnexal masses or tenderness noted.  Rectal - normal rectal, good sphincter tone, no masses felt  Extremities:  No swelling or varicosities noted  Chaperone present for exam  No results found for this or any  previous visit (from the past 24 hours).  Assessment & Plan:  1. Well woman exam with routine gynecological exam (Primary) - Pap smear 10/2022 - Mammogram 10/2023 - Colonoscopy 2022.   - Bone mineral density ordered.  Taking Vit D - lab work done 07/2024.  Plan fasting blood work next year. - vaccines reviewed/updated  2. Encounter for screening mammogram for malignant neoplasm of breast - MM 3D SCREENING MAMMOGRAM BILATERAL BREAST; Future  3. Vaginal atrophy - estradiol  (ESTRACE ) 0.01 % CREA vaginal cream; 1 gram vaginally twice weekly  Dispense: 42.5 g; Refill: 6   Orders Placed This Encounter  Procedures   MM 3D SCREENING MAMMOGRAM BILATERAL BREAST    Meds:  Meds ordered this encounter  Medications   estradiol  (ESTRACE ) 0.01 % CREA vaginal cream    Sig: 1 gram vaginally twice weekly    Dispense:  42.5 g     Refill:  6    Follow-up: Return in about 1 year (around 12/25/2025).  Ronal GORMAN Pinal, MD 12/25/2024 2:08 PM "
